# Patient Record
Sex: Male | Born: 1945
Health system: Southern US, Community
[De-identification: ages and names within clinical notes are randomized; demographics above are authoritative.]

## PROBLEM LIST (undated history)

## (undated) DIAGNOSIS — I10 Essential (primary) hypertension: Secondary | ICD-10-CM

## (undated) DIAGNOSIS — E119 Type 2 diabetes mellitus without complications: Secondary | ICD-10-CM

## (undated) HISTORY — PX: HERNIA REPAIR: SHX51

---

## 2015-09-23 DIAGNOSIS — M25461 Effusion, right knee: Secondary | ICD-10-CM | POA: Diagnosis not present

## 2015-09-23 DIAGNOSIS — M25561 Pain in right knee: Secondary | ICD-10-CM | POA: Diagnosis not present

## 2015-09-23 DIAGNOSIS — E1169 Type 2 diabetes mellitus with other specified complication: Secondary | ICD-10-CM | POA: Diagnosis not present

## 2015-09-23 DIAGNOSIS — I10 Essential (primary) hypertension: Secondary | ICD-10-CM | POA: Diagnosis not present

## 2015-10-02 DIAGNOSIS — M25461 Effusion, right knee: Secondary | ICD-10-CM | POA: Diagnosis not present

## 2015-10-02 DIAGNOSIS — M1710 Unilateral primary osteoarthritis, unspecified knee: Secondary | ICD-10-CM | POA: Diagnosis not present

## 2015-10-02 DIAGNOSIS — G8929 Other chronic pain: Secondary | ICD-10-CM | POA: Diagnosis not present

## 2015-10-02 DIAGNOSIS — M25561 Pain in right knee: Secondary | ICD-10-CM | POA: Diagnosis not present

## 2015-10-21 DIAGNOSIS — E1169 Type 2 diabetes mellitus with other specified complication: Secondary | ICD-10-CM | POA: Diagnosis not present

## 2015-10-21 DIAGNOSIS — E559 Vitamin D deficiency, unspecified: Secondary | ICD-10-CM | POA: Diagnosis not present

## 2015-10-21 DIAGNOSIS — M1710 Unilateral primary osteoarthritis, unspecified knee: Secondary | ICD-10-CM | POA: Diagnosis not present

## 2015-10-21 DIAGNOSIS — I10 Essential (primary) hypertension: Secondary | ICD-10-CM | POA: Diagnosis not present

## 2015-10-21 DIAGNOSIS — M545 Low back pain: Secondary | ICD-10-CM | POA: Diagnosis not present

## 2015-10-21 DIAGNOSIS — M542 Cervicalgia: Secondary | ICD-10-CM | POA: Diagnosis not present

## 2015-10-27 DIAGNOSIS — E118 Type 2 diabetes mellitus with unspecified complications: Secondary | ICD-10-CM | POA: Diagnosis not present

## 2015-10-27 DIAGNOSIS — E119 Type 2 diabetes mellitus without complications: Secondary | ICD-10-CM | POA: Diagnosis not present

## 2015-10-27 DIAGNOSIS — R531 Weakness: Secondary | ICD-10-CM | POA: Diagnosis not present

## 2015-10-27 DIAGNOSIS — I10 Essential (primary) hypertension: Secondary | ICD-10-CM | POA: Diagnosis not present

## 2015-10-27 DIAGNOSIS — M170 Bilateral primary osteoarthritis of knee: Secondary | ICD-10-CM | POA: Diagnosis not present

## 2015-10-27 DIAGNOSIS — D72829 Elevated white blood cell count, unspecified: Secondary | ICD-10-CM | POA: Diagnosis not present

## 2015-10-27 DIAGNOSIS — M5432 Sciatica, left side: Secondary | ICD-10-CM | POA: Diagnosis not present

## 2015-10-27 DIAGNOSIS — M79604 Pain in right leg: Secondary | ICD-10-CM | POA: Diagnosis not present

## 2015-10-27 DIAGNOSIS — E876 Hypokalemia: Secondary | ICD-10-CM | POA: Diagnosis not present

## 2015-10-27 DIAGNOSIS — M6281 Muscle weakness (generalized): Secondary | ICD-10-CM | POA: Diagnosis not present

## 2015-10-27 DIAGNOSIS — M25462 Effusion, left knee: Secondary | ICD-10-CM | POA: Diagnosis not present

## 2015-10-27 DIAGNOSIS — R262 Difficulty in walking, not elsewhere classified: Secondary | ICD-10-CM | POA: Diagnosis not present

## 2015-10-27 DIAGNOSIS — R52 Pain, unspecified: Secondary | ICD-10-CM | POA: Diagnosis not present

## 2015-10-27 DIAGNOSIS — M25461 Effusion, right knee: Secondary | ICD-10-CM | POA: Diagnosis not present

## 2015-10-27 DIAGNOSIS — M2550 Pain in unspecified joint: Secondary | ICD-10-CM | POA: Diagnosis not present

## 2015-10-27 DIAGNOSIS — M47816 Spondylosis without myelopathy or radiculopathy, lumbar region: Secondary | ICD-10-CM | POA: Diagnosis not present

## 2015-10-28 DIAGNOSIS — M170 Bilateral primary osteoarthritis of knee: Secondary | ICD-10-CM | POA: Diagnosis not present

## 2015-10-28 DIAGNOSIS — M6281 Muscle weakness (generalized): Secondary | ICD-10-CM | POA: Diagnosis not present

## 2015-10-28 DIAGNOSIS — I10 Essential (primary) hypertension: Secondary | ICD-10-CM | POA: Diagnosis not present

## 2015-10-28 DIAGNOSIS — E876 Hypokalemia: Secondary | ICD-10-CM | POA: Diagnosis not present

## 2015-10-28 DIAGNOSIS — D72829 Elevated white blood cell count, unspecified: Secondary | ICD-10-CM | POA: Diagnosis not present

## 2015-10-28 DIAGNOSIS — E118 Type 2 diabetes mellitus with unspecified complications: Secondary | ICD-10-CM | POA: Diagnosis not present

## 2015-10-29 DIAGNOSIS — E876 Hypokalemia: Secondary | ICD-10-CM | POA: Diagnosis not present

## 2015-10-29 DIAGNOSIS — E119 Type 2 diabetes mellitus without complications: Secondary | ICD-10-CM | POA: Diagnosis not present

## 2015-10-29 DIAGNOSIS — M170 Bilateral primary osteoarthritis of knee: Secondary | ICD-10-CM | POA: Diagnosis not present

## 2015-10-29 DIAGNOSIS — R531 Weakness: Secondary | ICD-10-CM | POA: Diagnosis not present

## 2015-10-30 DIAGNOSIS — M1710 Unilateral primary osteoarthritis, unspecified knee: Secondary | ICD-10-CM | POA: Diagnosis not present

## 2015-10-30 DIAGNOSIS — M25562 Pain in left knee: Secondary | ICD-10-CM | POA: Diagnosis not present

## 2015-10-30 DIAGNOSIS — E876 Hypokalemia: Secondary | ICD-10-CM | POA: Diagnosis not present

## 2015-10-30 DIAGNOSIS — G8929 Other chronic pain: Secondary | ICD-10-CM | POA: Diagnosis not present

## 2015-10-30 DIAGNOSIS — M25561 Pain in right knee: Secondary | ICD-10-CM | POA: Diagnosis not present

## 2015-10-30 DIAGNOSIS — M5442 Lumbago with sciatica, left side: Secondary | ICD-10-CM | POA: Diagnosis not present

## 2015-11-21 DIAGNOSIS — Z87891 Personal history of nicotine dependence: Secondary | ICD-10-CM | POA: Diagnosis not present

## 2015-11-21 DIAGNOSIS — R109 Unspecified abdominal pain: Secondary | ICD-10-CM | POA: Diagnosis not present

## 2015-11-21 DIAGNOSIS — R1900 Intra-abdominal and pelvic swelling, mass and lump, unspecified site: Secondary | ICD-10-CM | POA: Diagnosis not present

## 2015-11-21 DIAGNOSIS — S39012A Strain of muscle, fascia and tendon of lower back, initial encounter: Secondary | ICD-10-CM | POA: Diagnosis not present

## 2015-11-21 DIAGNOSIS — I10 Essential (primary) hypertension: Secondary | ICD-10-CM | POA: Diagnosis not present

## 2015-11-21 DIAGNOSIS — X58XXXA Exposure to other specified factors, initial encounter: Secondary | ICD-10-CM | POA: Diagnosis not present

## 2015-11-21 DIAGNOSIS — E119 Type 2 diabetes mellitus without complications: Secondary | ICD-10-CM | POA: Diagnosis not present

## 2015-11-21 DIAGNOSIS — R911 Solitary pulmonary nodule: Secondary | ICD-10-CM | POA: Diagnosis not present

## 2015-11-25 DIAGNOSIS — R911 Solitary pulmonary nodule: Secondary | ICD-10-CM | POA: Diagnosis not present

## 2015-11-25 DIAGNOSIS — R935 Abnormal findings on diagnostic imaging of other abdominal regions, including retroperitoneum: Secondary | ICD-10-CM | POA: Diagnosis not present

## 2015-11-25 DIAGNOSIS — M545 Low back pain: Secondary | ICD-10-CM | POA: Diagnosis not present

## 2015-11-25 DIAGNOSIS — Z09 Encounter for follow-up examination after completed treatment for conditions other than malignant neoplasm: Secondary | ICD-10-CM | POA: Diagnosis not present

## 2015-11-25 DIAGNOSIS — M25561 Pain in right knee: Secondary | ICD-10-CM | POA: Diagnosis not present

## 2015-11-25 DIAGNOSIS — G8929 Other chronic pain: Secondary | ICD-10-CM | POA: Diagnosis not present

## 2015-12-02 DIAGNOSIS — M545 Low back pain: Secondary | ICD-10-CM | POA: Diagnosis not present

## 2015-12-02 DIAGNOSIS — R634 Abnormal weight loss: Secondary | ICD-10-CM | POA: Diagnosis not present

## 2015-12-02 DIAGNOSIS — R109 Unspecified abdominal pain: Secondary | ICD-10-CM | POA: Diagnosis not present

## 2015-12-24 DIAGNOSIS — E118 Type 2 diabetes mellitus with unspecified complications: Secondary | ICD-10-CM | POA: Diagnosis not present

## 2015-12-24 DIAGNOSIS — R109 Unspecified abdominal pain: Secondary | ICD-10-CM | POA: Diagnosis not present

## 2015-12-24 DIAGNOSIS — R911 Solitary pulmonary nodule: Secondary | ICD-10-CM | POA: Diagnosis not present

## 2015-12-24 DIAGNOSIS — Z125 Encounter for screening for malignant neoplasm of prostate: Secondary | ICD-10-CM | POA: Diagnosis not present

## 2015-12-24 DIAGNOSIS — I10 Essential (primary) hypertension: Secondary | ICD-10-CM | POA: Diagnosis not present

## 2015-12-24 DIAGNOSIS — R634 Abnormal weight loss: Secondary | ICD-10-CM | POA: Diagnosis not present

## 2015-12-24 DIAGNOSIS — K869 Disease of pancreas, unspecified: Secondary | ICD-10-CM | POA: Diagnosis not present

## 2015-12-24 DIAGNOSIS — M25571 Pain in right ankle and joints of right foot: Secondary | ICD-10-CM | POA: Diagnosis not present

## 2015-12-24 DIAGNOSIS — M170 Bilateral primary osteoarthritis of knee: Secondary | ICD-10-CM | POA: Diagnosis not present

## 2015-12-24 LAB — BASIC METABOLIC PANEL
BUN: 12 (ref 4–21)
CREATININE: 0.6 (ref 0.6–1.3)
Glucose: 129
POTASSIUM: 4.3 (ref 3.4–5.3)
Sodium: 140 (ref 137–147)

## 2015-12-24 LAB — HEMOGLOBIN A1C: HEMOGLOBIN A1C: 6.5

## 2015-12-24 LAB — LIPID PANEL
Cholesterol: 118 (ref 0–200)
HDL: 35 (ref 35–70)
LDL Cholesterol: 69
TRIGLYCERIDES: 69 (ref 40–160)

## 2015-12-24 LAB — HEPATIC FUNCTION PANEL
ALK PHOS: 78 (ref 25–125)
ALT: 20 (ref 10–40)
AST: 14 (ref 14–40)
BILIRUBIN, TOTAL: 0.3

## 2015-12-24 LAB — CBC AND DIFFERENTIAL
HEMATOCRIT: 32 — AB (ref 41–53)
Hemoglobin: 10.5 — AB (ref 13.5–17.5)
WBC: 7.4

## 2015-12-24 LAB — PSA: PSA: 2.6

## 2015-12-24 LAB — TSH: TSH: 0.93 (ref 0.41–5.90)

## 2015-12-25 DIAGNOSIS — R918 Other nonspecific abnormal finding of lung field: Secondary | ICD-10-CM | POA: Diagnosis not present

## 2015-12-25 DIAGNOSIS — M19071 Primary osteoarthritis, right ankle and foot: Secondary | ICD-10-CM | POA: Diagnosis not present

## 2015-12-25 DIAGNOSIS — K5790 Diverticulosis of intestine, part unspecified, without perforation or abscess without bleeding: Secondary | ICD-10-CM | POA: Diagnosis not present

## 2015-12-25 DIAGNOSIS — M25571 Pain in right ankle and joints of right foot: Secondary | ICD-10-CM | POA: Diagnosis not present

## 2015-12-25 DIAGNOSIS — K869 Disease of pancreas, unspecified: Secondary | ICD-10-CM | POA: Diagnosis not present

## 2015-12-25 DIAGNOSIS — M19079 Primary osteoarthritis, unspecified ankle and foot: Secondary | ICD-10-CM | POA: Diagnosis not present

## 2016-01-05 DIAGNOSIS — D509 Iron deficiency anemia, unspecified: Secondary | ICD-10-CM | POA: Diagnosis not present

## 2016-01-05 DIAGNOSIS — Z1211 Encounter for screening for malignant neoplasm of colon: Secondary | ICD-10-CM | POA: Diagnosis not present

## 2016-01-05 DIAGNOSIS — M170 Bilateral primary osteoarthritis of knee: Secondary | ICD-10-CM | POA: Diagnosis not present

## 2016-01-05 DIAGNOSIS — M25569 Pain in unspecified knee: Secondary | ICD-10-CM | POA: Diagnosis not present

## 2016-01-05 DIAGNOSIS — M25571 Pain in right ankle and joints of right foot: Secondary | ICD-10-CM | POA: Diagnosis not present

## 2016-01-05 DIAGNOSIS — R69 Illness, unspecified: Secondary | ICD-10-CM | POA: Diagnosis not present

## 2016-01-05 DIAGNOSIS — Z1159 Encounter for screening for other viral diseases: Secondary | ICD-10-CM | POA: Diagnosis not present

## 2016-01-05 DIAGNOSIS — M545 Low back pain: Secondary | ICD-10-CM | POA: Diagnosis not present

## 2016-01-05 DIAGNOSIS — M47816 Spondylosis without myelopathy or radiculopathy, lumbar region: Secondary | ICD-10-CM | POA: Diagnosis not present

## 2016-01-05 DIAGNOSIS — R634 Abnormal weight loss: Secondary | ICD-10-CM | POA: Diagnosis not present

## 2016-01-05 DIAGNOSIS — E118 Type 2 diabetes mellitus with unspecified complications: Secondary | ICD-10-CM | POA: Diagnosis not present

## 2016-01-05 DIAGNOSIS — R911 Solitary pulmonary nodule: Secondary | ICD-10-CM | POA: Diagnosis not present

## 2016-01-13 DIAGNOSIS — M11869 Other specified crystal arthropathies, unspecified knee: Secondary | ICD-10-CM | POA: Diagnosis not present

## 2016-01-13 DIAGNOSIS — R634 Abnormal weight loss: Secondary | ICD-10-CM | POA: Diagnosis not present

## 2016-01-15 DIAGNOSIS — D509 Iron deficiency anemia, unspecified: Secondary | ICD-10-CM | POA: Diagnosis not present

## 2016-01-15 DIAGNOSIS — Z01818 Encounter for other preprocedural examination: Secondary | ICD-10-CM | POA: Diagnosis not present

## 2016-01-15 DIAGNOSIS — Z1211 Encounter for screening for malignant neoplasm of colon: Secondary | ICD-10-CM | POA: Diagnosis not present

## 2016-01-28 DIAGNOSIS — E119 Type 2 diabetes mellitus without complications: Secondary | ICD-10-CM | POA: Diagnosis not present

## 2016-01-28 DIAGNOSIS — K648 Other hemorrhoids: Secondary | ICD-10-CM | POA: Diagnosis not present

## 2016-01-28 DIAGNOSIS — K449 Diaphragmatic hernia without obstruction or gangrene: Secondary | ICD-10-CM | POA: Diagnosis not present

## 2016-01-28 DIAGNOSIS — B9681 Helicobacter pylori [H. pylori] as the cause of diseases classified elsewhere: Secondary | ICD-10-CM | POA: Diagnosis not present

## 2016-01-28 DIAGNOSIS — I10 Essential (primary) hypertension: Secondary | ICD-10-CM | POA: Diagnosis not present

## 2016-01-28 DIAGNOSIS — K2950 Unspecified chronic gastritis without bleeding: Secondary | ICD-10-CM | POA: Diagnosis not present

## 2016-01-28 DIAGNOSIS — D509 Iron deficiency anemia, unspecified: Secondary | ICD-10-CM | POA: Diagnosis not present

## 2016-01-28 DIAGNOSIS — Z1211 Encounter for screening for malignant neoplasm of colon: Secondary | ICD-10-CM | POA: Diagnosis not present

## 2016-01-28 DIAGNOSIS — R634 Abnormal weight loss: Secondary | ICD-10-CM | POA: Diagnosis not present

## 2016-01-28 DIAGNOSIS — K5909 Other constipation: Secondary | ICD-10-CM | POA: Diagnosis not present

## 2016-01-28 DIAGNOSIS — M1990 Unspecified osteoarthritis, unspecified site: Secondary | ICD-10-CM | POA: Diagnosis not present

## 2016-01-28 DIAGNOSIS — K5730 Diverticulosis of large intestine without perforation or abscess without bleeding: Secondary | ICD-10-CM | POA: Diagnosis not present

## 2016-01-28 DIAGNOSIS — G4730 Sleep apnea, unspecified: Secondary | ICD-10-CM | POA: Diagnosis not present

## 2016-02-11 DIAGNOSIS — M109 Gout, unspecified: Secondary | ICD-10-CM | POA: Diagnosis not present

## 2016-02-11 DIAGNOSIS — Z1321 Encounter for screening for nutritional disorder: Secondary | ICD-10-CM | POA: Diagnosis not present

## 2016-02-11 DIAGNOSIS — M11869 Other specified crystal arthropathies, unspecified knee: Secondary | ICD-10-CM | POA: Diagnosis not present

## 2016-02-11 DIAGNOSIS — I2510 Atherosclerotic heart disease of native coronary artery without angina pectoris: Secondary | ICD-10-CM | POA: Diagnosis not present

## 2016-02-11 DIAGNOSIS — M5136 Other intervertebral disc degeneration, lumbar region: Secondary | ICD-10-CM | POA: Diagnosis not present

## 2016-02-11 DIAGNOSIS — M170 Bilateral primary osteoarthritis of knee: Secondary | ICD-10-CM | POA: Diagnosis not present

## 2016-02-11 DIAGNOSIS — R911 Solitary pulmonary nodule: Secondary | ICD-10-CM | POA: Diagnosis not present

## 2016-02-11 DIAGNOSIS — D508 Other iron deficiency anemias: Secondary | ICD-10-CM | POA: Diagnosis not present

## 2016-02-11 DIAGNOSIS — D509 Iron deficiency anemia, unspecified: Secondary | ICD-10-CM | POA: Diagnosis not present

## 2016-02-11 DIAGNOSIS — I10 Essential (primary) hypertension: Secondary | ICD-10-CM | POA: Diagnosis not present

## 2016-02-11 DIAGNOSIS — M545 Low back pain: Secondary | ICD-10-CM | POA: Diagnosis not present

## 2016-03-11 DIAGNOSIS — H524 Presbyopia: Secondary | ICD-10-CM | POA: Diagnosis not present

## 2016-03-11 DIAGNOSIS — Z136 Encounter for screening for cardiovascular disorders: Secondary | ICD-10-CM | POA: Diagnosis not present

## 2016-03-11 DIAGNOSIS — H2513 Age-related nuclear cataract, bilateral: Secondary | ICD-10-CM | POA: Diagnosis not present

## 2016-03-11 DIAGNOSIS — H5203 Hypermetropia, bilateral: Secondary | ICD-10-CM | POA: Diagnosis not present

## 2016-03-11 DIAGNOSIS — I2510 Atherosclerotic heart disease of native coronary artery without angina pectoris: Secondary | ICD-10-CM | POA: Diagnosis not present

## 2016-03-11 DIAGNOSIS — H35033 Hypertensive retinopathy, bilateral: Secondary | ICD-10-CM | POA: Diagnosis not present

## 2016-03-11 DIAGNOSIS — H04123 Dry eye syndrome of bilateral lacrimal glands: Secondary | ICD-10-CM | POA: Diagnosis not present

## 2016-03-11 DIAGNOSIS — Z87891 Personal history of nicotine dependence: Secondary | ICD-10-CM | POA: Diagnosis not present

## 2016-03-11 DIAGNOSIS — E119 Type 2 diabetes mellitus without complications: Secondary | ICD-10-CM | POA: Diagnosis not present

## 2016-03-11 DIAGNOSIS — H52223 Regular astigmatism, bilateral: Secondary | ICD-10-CM | POA: Diagnosis not present

## 2016-07-01 DIAGNOSIS — M25571 Pain in right ankle and joints of right foot: Secondary | ICD-10-CM | POA: Diagnosis not present

## 2016-07-01 DIAGNOSIS — M11269 Other chondrocalcinosis, unspecified knee: Secondary | ICD-10-CM | POA: Diagnosis not present

## 2016-07-01 DIAGNOSIS — M170 Bilateral primary osteoarthritis of knee: Secondary | ICD-10-CM | POA: Diagnosis not present

## 2016-07-01 DIAGNOSIS — M545 Low back pain: Secondary | ICD-10-CM | POA: Diagnosis not present

## 2016-07-01 DIAGNOSIS — M1189 Other specified crystal arthropathies, multiple sites: Secondary | ICD-10-CM | POA: Diagnosis not present

## 2016-07-01 DIAGNOSIS — M109 Gout, unspecified: Secondary | ICD-10-CM | POA: Diagnosis not present

## 2016-07-01 DIAGNOSIS — K2960 Other gastritis without bleeding: Secondary | ICD-10-CM | POA: Diagnosis not present

## 2016-07-08 DIAGNOSIS — I10 Essential (primary) hypertension: Secondary | ICD-10-CM | POA: Diagnosis not present

## 2016-07-08 DIAGNOSIS — M109 Gout, unspecified: Secondary | ICD-10-CM | POA: Diagnosis not present

## 2016-07-13 DIAGNOSIS — M170 Bilateral primary osteoarthritis of knee: Secondary | ICD-10-CM | POA: Diagnosis not present

## 2016-07-13 DIAGNOSIS — M1990 Unspecified osteoarthritis, unspecified site: Secondary | ICD-10-CM | POA: Diagnosis not present

## 2016-07-13 DIAGNOSIS — M069 Rheumatoid arthritis, unspecified: Secondary | ICD-10-CM | POA: Diagnosis not present

## 2016-07-17 DIAGNOSIS — R600 Localized edema: Secondary | ICD-10-CM | POA: Diagnosis not present

## 2016-07-17 DIAGNOSIS — E0840 Diabetes mellitus due to underlying condition with diabetic neuropathy, unspecified: Secondary | ICD-10-CM | POA: Diagnosis not present

## 2016-07-17 DIAGNOSIS — R3915 Urgency of urination: Secondary | ICD-10-CM | POA: Diagnosis not present

## 2016-07-17 DIAGNOSIS — I10 Essential (primary) hypertension: Secondary | ICD-10-CM | POA: Diagnosis not present

## 2016-07-17 DIAGNOSIS — Z7982 Long term (current) use of aspirin: Secondary | ICD-10-CM | POA: Diagnosis not present

## 2016-07-17 DIAGNOSIS — M159 Polyosteoarthritis, unspecified: Secondary | ICD-10-CM | POA: Diagnosis not present

## 2016-07-17 DIAGNOSIS — Z0000 Encounter for general adult medical examination without abnormal findings: Secondary | ICD-10-CM | POA: Diagnosis not present

## 2016-07-17 DIAGNOSIS — Z6836 Body mass index (BMI) 36.0-36.9, adult: Secondary | ICD-10-CM | POA: Diagnosis not present

## 2016-07-17 DIAGNOSIS — K219 Gastro-esophageal reflux disease without esophagitis: Secondary | ICD-10-CM | POA: Diagnosis not present

## 2016-07-22 DIAGNOSIS — M170 Bilateral primary osteoarthritis of knee: Secondary | ICD-10-CM | POA: Diagnosis not present

## 2016-07-22 DIAGNOSIS — M069 Rheumatoid arthritis, unspecified: Secondary | ICD-10-CM | POA: Diagnosis not present

## 2016-07-22 DIAGNOSIS — E559 Vitamin D deficiency, unspecified: Secondary | ICD-10-CM | POA: Diagnosis not present

## 2016-07-22 DIAGNOSIS — M1990 Unspecified osteoarthritis, unspecified site: Secondary | ICD-10-CM | POA: Diagnosis not present

## 2016-07-22 DIAGNOSIS — I2510 Atherosclerotic heart disease of native coronary artery without angina pectoris: Secondary | ICD-10-CM | POA: Diagnosis not present

## 2016-07-22 DIAGNOSIS — I10 Essential (primary) hypertension: Secondary | ICD-10-CM | POA: Diagnosis not present

## 2016-07-22 DIAGNOSIS — M5136 Other intervertebral disc degeneration, lumbar region: Secondary | ICD-10-CM | POA: Diagnosis not present

## 2016-07-22 DIAGNOSIS — E118 Type 2 diabetes mellitus with unspecified complications: Secondary | ICD-10-CM | POA: Diagnosis not present

## 2016-07-23 DIAGNOSIS — M069 Rheumatoid arthritis, unspecified: Secondary | ICD-10-CM | POA: Diagnosis not present

## 2016-07-23 DIAGNOSIS — Z79899 Other long term (current) drug therapy: Secondary | ICD-10-CM | POA: Diagnosis not present

## 2016-07-23 DIAGNOSIS — M0579 Rheumatoid arthritis with rheumatoid factor of multiple sites without organ or systems involvement: Secondary | ICD-10-CM | POA: Diagnosis not present

## 2016-07-26 DIAGNOSIS — Z13220 Encounter for screening for lipoid disorders: Secondary | ICD-10-CM | POA: Diagnosis not present

## 2016-07-26 DIAGNOSIS — I10 Essential (primary) hypertension: Secondary | ICD-10-CM | POA: Diagnosis not present

## 2016-07-26 DIAGNOSIS — E118 Type 2 diabetes mellitus with unspecified complications: Secondary | ICD-10-CM | POA: Diagnosis not present

## 2016-12-24 DIAGNOSIS — Z79899 Other long term (current) drug therapy: Secondary | ICD-10-CM | POA: Diagnosis not present

## 2016-12-24 DIAGNOSIS — M0579 Rheumatoid arthritis with rheumatoid factor of multiple sites without organ or systems involvement: Secondary | ICD-10-CM | POA: Diagnosis not present

## 2017-01-21 DIAGNOSIS — I2510 Atherosclerotic heart disease of native coronary artery without angina pectoris: Secondary | ICD-10-CM | POA: Diagnosis not present

## 2017-01-21 DIAGNOSIS — D509 Iron deficiency anemia, unspecified: Secondary | ICD-10-CM | POA: Diagnosis not present

## 2017-01-21 DIAGNOSIS — E559 Vitamin D deficiency, unspecified: Secondary | ICD-10-CM | POA: Diagnosis not present

## 2017-01-21 DIAGNOSIS — E118 Type 2 diabetes mellitus with unspecified complications: Secondary | ICD-10-CM | POA: Diagnosis not present

## 2017-01-21 DIAGNOSIS — I10 Essential (primary) hypertension: Secondary | ICD-10-CM | POA: Diagnosis not present

## 2017-01-21 DIAGNOSIS — K2960 Other gastritis without bleeding: Secondary | ICD-10-CM | POA: Diagnosis not present

## 2017-01-28 DIAGNOSIS — I10 Essential (primary) hypertension: Secondary | ICD-10-CM | POA: Diagnosis not present

## 2017-02-04 DIAGNOSIS — I10 Essential (primary) hypertension: Secondary | ICD-10-CM | POA: Diagnosis not present

## 2017-02-04 DIAGNOSIS — I2510 Atherosclerotic heart disease of native coronary artery without angina pectoris: Secondary | ICD-10-CM | POA: Diagnosis not present

## 2017-04-15 DIAGNOSIS — M0579 Rheumatoid arthritis with rheumatoid factor of multiple sites without organ or systems involvement: Secondary | ICD-10-CM | POA: Diagnosis not present

## 2017-04-15 DIAGNOSIS — Z79899 Other long term (current) drug therapy: Secondary | ICD-10-CM | POA: Diagnosis not present

## 2017-04-22 DIAGNOSIS — Z79899 Other long term (current) drug therapy: Secondary | ICD-10-CM | POA: Diagnosis not present

## 2017-04-22 DIAGNOSIS — M0579 Rheumatoid arthritis with rheumatoid factor of multiple sites without organ or systems involvement: Secondary | ICD-10-CM | POA: Diagnosis not present

## 2017-07-15 ENCOUNTER — Encounter: Payer: Self-pay | Admitting: Family Medicine

## 2017-07-15 ENCOUNTER — Ambulatory Visit (INDEPENDENT_AMBULATORY_CARE_PROVIDER_SITE_OTHER): Payer: Medicare HMO | Admitting: Family Medicine

## 2017-07-15 VITALS — BP 142/90 | HR 86 | Temp 98.2°F | Ht 69.0 in | Wt 263.1 lb

## 2017-07-15 DIAGNOSIS — R1013 Epigastric pain: Secondary | ICD-10-CM | POA: Diagnosis not present

## 2017-07-15 DIAGNOSIS — M0579 Rheumatoid arthritis with rheumatoid factor of multiple sites without organ or systems involvement: Secondary | ICD-10-CM | POA: Diagnosis not present

## 2017-07-15 DIAGNOSIS — I10 Essential (primary) hypertension: Secondary | ICD-10-CM

## 2017-07-15 DIAGNOSIS — Z1211 Encounter for screening for malignant neoplasm of colon: Secondary | ICD-10-CM

## 2017-07-15 DIAGNOSIS — E119 Type 2 diabetes mellitus without complications: Secondary | ICD-10-CM | POA: Diagnosis not present

## 2017-07-15 DIAGNOSIS — I2510 Atherosclerotic heart disease of native coronary artery without angina pectoris: Secondary | ICD-10-CM

## 2017-07-15 DIAGNOSIS — R0602 Shortness of breath: Secondary | ICD-10-CM | POA: Diagnosis not present

## 2017-07-15 DIAGNOSIS — Z125 Encounter for screening for malignant neoplasm of prostate: Secondary | ICD-10-CM | POA: Diagnosis not present

## 2017-07-15 DIAGNOSIS — E1165 Type 2 diabetes mellitus with hyperglycemia: Secondary | ICD-10-CM | POA: Insufficient documentation

## 2017-07-15 DIAGNOSIS — Z0000 Encounter for general adult medical examination without abnormal findings: Secondary | ICD-10-CM | POA: Insufficient documentation

## 2017-07-15 NOTE — Progress Notes (Signed)
Subjective:  Patient ID: Mitchell Rose, male    DOB: 03/19/1946  Age: 72 y.o. MRN: [id number]  CC: Establish Care   HPI Kazumi Lachney presents for the establishment of care.  He is well-known to me.  He is nonfasting today.  He has an history of of coronary artery disease with a normal lipid profile.  He has been compliant with his BP regimen but tells that he did have a high salt load meal before coming in.  He is experiencing some shortness of breath with exertion that is new for him.  He has not seen his cardiologist in over a year.  He also has been having some midepigastric abdominal pain  For about a week now.  He denies chest pain, nausea and vomiting, diaphoresis or change in his stooling patterns.  Says that he stools once or twice a day and his stools of been without melena or blood.  He is about due for a follow-up colonoscopy.  Wants me to schedule it.  His urine flow is good he does not deal with hesitancy or nocturia.  He refuses the flu vaccine because his sister got one and died.  He does not smoke drink alcohol or use illicit drugs.  He continues to live with his wife.  He continues to work at [employer].  Outpatient Medications Prior to Visit  Medication Sig Dispense Refill  . amLODipine (NORVASC) 5 MG tablet Take 1 tablet by mouth daily.    Marland Kitchen aspirin 81 MG chewable tablet Chew 1 tablet by mouth daily.    . chlorthalidone (HYGROTON) 25 MG tablet Take 1 tablet by mouth daily.    . folic acid (FOLVITE) 1 MG tablet Take 1 tablet by mouth daily.    . metFORMIN (GLUCOPHAGE) 1000 MG tablet Take 1 tablet by mouth 2 (two) times daily.    . methotrexate (RHEUMATREX) 2.5 MG tablet Take 7 tablets by mouth once a week.    . metoprolol tartrate (LOPRESSOR) 100 MG tablet Take 100 mg by mouth 2 (two) times daily.    Marland Kitchen omeprazole (PRILOSEC) 20 MG capsule Take 1 capsule by mouth daily.    . predniSONE (DELTASONE) 5 MG tablet Take 1 tablet by mouth daily.     No facility-administered  medications prior to visit.     ROS Review of Systems  Constitutional: Negative.  Negative for chills, fatigue, fever and unexpected weight change.  HENT: Negative.   Eyes: Negative for photophobia and visual disturbance.  Respiratory: Positive for shortness of breath. Negative for chest tightness and wheezing.   Cardiovascular: Negative for chest pain, palpitations and leg swelling.  Gastrointestinal: Negative for anal bleeding, blood in stool, constipation, diarrhea, nausea, rectal pain and vomiting.  Endocrine: Negative for polyphagia and polyuria.  Genitourinary: Negative for difficulty urinating, frequency and hematuria.  Skin: Negative for color change and rash.  Allergic/Immunologic: Negative for immunocompromised state.  Neurological: Negative for speech difficulty and headaches.  Hematological: Does not bruise/bleed easily.  Psychiatric/Behavioral: Negative.  Negative for dysphoric mood. The patient is not nervous/anxious.     Objective:  BP (!) 142/90 (BP Location: Left Arm, Patient Position: Sitting, Cuff Size: Normal)   Pulse 86   Temp 98.2 F (36.8 C) (Oral)   Ht 5\' 9"  (1.753 m)   Wt 263 lb 2 oz (119.4 kg)   SpO2 99%   BMI 38.86 kg/m   BP Readings from Last 3 Encounters:  07/15/17 (!) 142/90    Wt Readings from Last 3 Encounters:  07/15/17 263 lb 2 oz (119.4 kg)    Physical Exam  Constitutional: He is oriented to person, place, and time. He appears well-developed and well-nourished. No distress.  HENT:  Head: Normocephalic and atraumatic.  Right Ear: External ear normal.  Left Ear: External ear normal.  Mouth/Throat: Oropharynx is clear and moist. No oropharyngeal exudate.  Eyes: Conjunctivae and EOM are normal. Pupils are equal, round, and reactive to light. Right eye exhibits no discharge. Left eye exhibits no discharge. No scleral icterus.  Neck: Neck supple. No JVD present. No tracheal deviation present.  Cardiovascular: Normal rate, regular rhythm and  normal heart sounds.  Pulmonary/Chest: Breath sounds normal. No stridor. No respiratory distress. He has no wheezes. He has no rales.  Abdominal: Soft. Bowel sounds are normal. He exhibits distension. There is no tenderness. There is no rebound, no guarding and no CVA tenderness. A hernia is present. Hernia confirmed positive in the ventral area.  Genitourinary: Rectal exam shows no external hemorrhoid, no internal hemorrhoid, no fissure, no mass, no tenderness, anal tone normal and guaiac negative stool. Prostate is not enlarged and not tender.  Lymphadenopathy:    He has no cervical adenopathy.  Neurological: He is alert and oriented to person, place, and time.  Skin: Skin is warm and dry. He is not diaphoretic.  Psychiatric: He has a normal mood and affect. His behavior is normal.    No results found for: WBC, HGB, HCT, PLT, GLUCOSE, CHOL, TRIG, HDL, LDLDIRECT, LDLCALC, ALT, AST, NA, K, CL, CREATININE, BUN, CO2, TSH, PSA, INR, GLUF, HGBA1C, MICROALBUR  Patient was never admitted.  Assessment & Plan:   Brigg was seen today for establish care.  Diagnoses and all orders for this visit:  Essential hypertension -     CBC; Future -     Comprehensive metabolic panel; Future -     TSH; Future -     Urinalysis, Routine w reflex microscopic; Future  ASCVD (arteriosclerotic cardiovascular disease) -     CBC; Future -     Comprehensive metabolic panel; Future -     Lipid panel; Future -     TSH; Future -     Ambulatory referral to Cardiology  Rheumatoid arthritis involving multiple sites with positive rheumatoid factor ([place])  Screen for colon cancer -     Ambulatory referral to Gastroenterology  Screening for prostate cancer -     PSA; Future  Epigastric pain -     Amylase; Future -     CBC; Future -     Comprehensive metabolic panel; Future  Shortness of breath -     TSH; Future -     Ambulatory referral to Cardiology  Controlled type 2 diabetes mellitus without  complication, without long-term current use of insulin (HCC) -     Hemoglobin A1c; Future -     Microalbumin / creatinine urine ratio; Future   I am having Candee Furbish maintain his amLODipine, aspirin, chlorthalidone, folic acid, metFORMIN, methotrexate, omeprazole, predniSONE, and metoprolol tartrate.  No orders of the defined types were placed in this encounter.  He will return on Monday for fasting lab work.  I asked him to return next week if his abdominal pain worsens or does not improve on its own.  Follow-up: No Follow-up on file.  Libby Maw, MD

## 2017-07-18 ENCOUNTER — Other Ambulatory Visit (INDEPENDENT_AMBULATORY_CARE_PROVIDER_SITE_OTHER): Payer: Medicare HMO

## 2017-07-18 DIAGNOSIS — I2510 Atherosclerotic heart disease of native coronary artery without angina pectoris: Secondary | ICD-10-CM | POA: Diagnosis not present

## 2017-07-18 DIAGNOSIS — I10 Essential (primary) hypertension: Secondary | ICD-10-CM | POA: Diagnosis not present

## 2017-07-18 DIAGNOSIS — Z125 Encounter for screening for malignant neoplasm of prostate: Secondary | ICD-10-CM | POA: Diagnosis not present

## 2017-07-18 DIAGNOSIS — E119 Type 2 diabetes mellitus without complications: Secondary | ICD-10-CM

## 2017-07-18 DIAGNOSIS — R1013 Epigastric pain: Secondary | ICD-10-CM

## 2017-07-18 DIAGNOSIS — R0602 Shortness of breath: Secondary | ICD-10-CM

## 2017-07-18 LAB — CBC WITH DIFFERENTIAL/PLATELET
BASOS ABS: 53 {cells}/uL (ref 0–200)
BASOS PCT: 0.8 %
EOS ABS: 172 {cells}/uL (ref 15–500)
Eosinophils Relative: 2.6 %
HCT: 38.1 % — ABNORMAL LOW (ref 38.5–50.0)
Hemoglobin: 12.9 g/dL — ABNORMAL LOW (ref 13.2–17.1)
Lymphs Abs: 3267 cells/uL (ref 850–3900)
MCH: 29.3 pg (ref 27.0–33.0)
MCHC: 33.9 g/dL (ref 32.0–36.0)
MCV: 86.4 fL (ref 80.0–100.0)
MONOS PCT: 10.8 %
MPV: 11 fL (ref 7.5–12.5)
NEUTROS PCT: 36.3 %
Neutro Abs: 2396 cells/uL (ref 1500–7800)
PLATELETS: 333 10*3/uL (ref 140–400)
RBC: 4.41 10*6/uL (ref 4.20–5.80)
RDW: 13.9 % (ref 11.0–15.0)
TOTAL LYMPHOCYTE: 49.5 %
WBC: 6.6 10*3/uL (ref 3.8–10.8)
WBCMIX: 713 {cells}/uL (ref 200–950)

## 2017-07-18 LAB — URINALYSIS, ROUTINE W REFLEX MICROSCOPIC
Bilirubin Urine: NEGATIVE
Hgb urine dipstick: NEGATIVE
KETONES UR: NEGATIVE
Leukocytes, UA: NEGATIVE
Nitrite: NEGATIVE
PH: 7.5 (ref 5.0–8.0)
RBC / HPF: NONE SEEN (ref 0–?)
SPECIFIC GRAVITY, URINE: 1.01 (ref 1.000–1.030)
TOTAL PROTEIN, URINE-UPE24: NEGATIVE
URINE GLUCOSE: NEGATIVE
UROBILINOGEN UA: 1 (ref 0.0–1.0)

## 2017-07-18 LAB — COMPREHENSIVE METABOLIC PANEL
ALT: 18 U/L (ref 0–53)
AST: 15 U/L (ref 0–37)
Albumin: 4 g/dL (ref 3.5–5.2)
Alkaline Phosphatase: 53 U/L (ref 39–117)
BUN: 13 mg/dL (ref 6–23)
CO2: 34 meq/L — AB (ref 19–32)
Calcium: 9.8 mg/dL (ref 8.4–10.5)
Chloride: 96 mEq/L (ref 96–112)
Creatinine, Ser: 0.84 mg/dL (ref 0.40–1.50)
GFR: 96.17 mL/min (ref >60.00)
GLUCOSE: 137 mg/dL — AB (ref 70–99)
POTASSIUM: 3.8 meq/L (ref 3.5–5.1)
Sodium: 143 mEq/L (ref 135–145)
Total Bilirubin: 0.4 mg/dL (ref 0.2–1.2)
Total Protein: 7.4 g/dL (ref 6.0–8.3)

## 2017-07-18 LAB — LIPID PANEL
CHOL/HDL RATIO: 5
Cholesterol: 159 mg/dL (ref 0–200)
HDL: 34.2 mg/dL — AB (ref >39.00)
LDL CALC: 102 mg/dL — AB (ref 0–99)
NONHDL: 124.69
Triglycerides: 112 mg/dL (ref 0.0–149.0)
VLDL: 22.4 mg/dL (ref 0.0–40.0)

## 2017-07-18 LAB — TSH: TSH: 1.36 u[IU]/mL (ref 0.35–4.50)

## 2017-07-18 LAB — MICROALBUMIN / CREATININE URINE RATIO
Creatinine,U: 149.9 mg/dL
Microalb Creat Ratio: 1 mg/g (ref 0.0–30.0)
Microalb, Ur: 1.5 mg/dL (ref 0.0–1.9)

## 2017-07-18 LAB — PSA: PSA: 2.73 ng/mL (ref 0.10–4.00)

## 2017-07-18 LAB — AMYLASE: AMYLASE: 81 U/L (ref 27–131)

## 2017-07-18 LAB — HEMOGLOBIN A1C: Hgb A1c MFr Bld: 7.9 % — ABNORMAL HIGH (ref 4.6–6.5)

## 2017-07-18 NOTE — Addendum Note (Signed)
Addended by: Diona Foley on: 07/18/2017 08:30 AM   Modules accepted: Orders

## 2017-07-19 ENCOUNTER — Telehealth: Payer: Self-pay

## 2017-07-19 NOTE — Telephone Encounter (Signed)
Dr. Ethelene Hal would like to see patient to go over his lab results, please help him to get scheduled.  Thank you guys!

## 2017-07-20 NOTE — Telephone Encounter (Signed)
I have called and  Spoken to patient wife. Patient wife schedule an appointment for 07/22/17 @ 11am with Dr. Ethelene Hal.

## 2017-07-22 ENCOUNTER — Encounter: Payer: Self-pay | Admitting: Family Medicine

## 2017-07-22 ENCOUNTER — Ambulatory Visit (INDEPENDENT_AMBULATORY_CARE_PROVIDER_SITE_OTHER): Payer: Medicare HMO | Admitting: Family Medicine

## 2017-07-22 VITALS — BP 130/80 | HR 80 | Ht 69.0 in | Wt 265.0 lb

## 2017-07-22 DIAGNOSIS — E1165 Type 2 diabetes mellitus with hyperglycemia: Secondary | ICD-10-CM

## 2017-07-22 DIAGNOSIS — I2510 Atherosclerotic heart disease of native coronary artery without angina pectoris: Secondary | ICD-10-CM | POA: Diagnosis not present

## 2017-07-22 DIAGNOSIS — D649 Anemia, unspecified: Secondary | ICD-10-CM | POA: Diagnosis not present

## 2017-07-22 DIAGNOSIS — E7800 Pure hypercholesterolemia, unspecified: Secondary | ICD-10-CM

## 2017-07-22 MED ORDER — DAPAGLIFLOZIN PROPANEDIOL 5 MG PO TABS: 5.0000 mg | ORAL_TABLET | Freq: Every day | ORAL | 2 refills | Status: DC

## 2017-07-22 MED ORDER — PRAVASTATIN SODIUM 20 MG PO TABS: 20.0000 mg | ORAL_TABLET | Freq: Every day | ORAL | 1 refills | Status: DC

## 2017-07-22 NOTE — Progress Notes (Signed)
Subjective:  Patient ID: Mitchell Rose, male    DOB: 1945/11/13  Age: 72 y.o. MRN: [id number]  CC: Follow-up (discuss labs)   HPI Mitchell Rose presents for follow-up of multiple concerns that I have about his laboratory results.  His hemoglobin dropped a little bit.  It is normocytic.  He denies any blood loss through his urine or feces.  Stressed the importance that he go for the colonoscopy as this could be a colon cancer were dealing with.  He has had no change in his stooling patterns.  His diabetes control is worsening.  He admits to some weight gain.  He is compliant with his Glucophage 1000 mg twice each day.  His LDL cholesterol has crept up it is up over 100.  He has documented vascular disease.  Said that it was time to go ahead and start a statin.  Outpatient Medications Prior to Visit  Medication Sig Dispense Refill  . amLODipine (NORVASC) 5 MG tablet Take 1 tablet by mouth daily.    Marland Kitchen aspirin 81 MG chewable tablet Chew 1 tablet by mouth daily.    . chlorthalidone (HYGROTON) 25 MG tablet Take 1 tablet by mouth daily.    . folic acid (FOLVITE) 1 MG tablet Take 1 tablet by mouth daily.    . metFORMIN (GLUCOPHAGE) 1000 MG tablet Take 1 tablet by mouth 2 (two) times daily.    . methotrexate (RHEUMATREX) 2.5 MG tablet Take 7 tablets by mouth once a week.    . metoprolol tartrate (LOPRESSOR) 100 MG tablet Take 100 mg by mouth 2 (two) times daily.    Marland Kitchen omeprazole (PRILOSEC) 20 MG capsule Take 1 capsule by mouth daily.    . predniSONE (DELTASONE) 5 MG tablet Take 1 tablet by mouth daily.     No facility-administered medications prior to visit.     ROS Review of Systems  Constitutional: Negative for chills, fatigue, fever and unexpected weight change.  Respiratory: Negative.   Cardiovascular: Negative.   Gastrointestinal: Negative for abdominal distention, abdominal pain, anal bleeding, blood in stool, constipation and diarrhea.  Endocrine: Negative for polyphagia and polyuria.    Genitourinary: Negative for hematuria.  Hematological: Does not bruise/bleed easily.    Objective:  BP 130/80 (BP Location: Left Arm, Patient Position: Sitting, Cuff Size: Normal)   Pulse 80   Ht 5\' 9"  (1.753 m)   Wt 265 lb (120.2 kg)   SpO2 97%   BMI 39.13 kg/m   BP Readings from Last 3 Encounters:  07/22/17 130/80  07/15/17 (!) 142/90    Wt Readings from Last 3 Encounters:  07/22/17 265 lb (120.2 kg)  07/15/17 263 lb 2 oz (119.4 kg)    Physical Exam  Constitutional: He is oriented to person, place, and time. He appears well-developed and well-nourished. No distress.  HENT:  Head: Normocephalic and atraumatic.  Right Ear: External ear normal.  Left Ear: External ear normal.  Eyes: Right eye exhibits no discharge. Left eye exhibits no discharge. No scleral icterus.  Neck: No JVD present. No tracheal deviation present.  Pulmonary/Chest: Effort normal. No stridor.  Neurological: He is alert and oriented to person, place, and time.  Skin: Skin is warm and dry. He is not diaphoretic.  Psychiatric: He has a normal mood and affect. His behavior is normal.    Lab Results  Component Value Date   WBC 6.6 07/18/2017   HGB 12.9 (L) 07/18/2017   HCT 38.1 (L) 07/18/2017   PLT 333 07/18/2017   GLUCOSE  137 (H) 07/18/2017   CHOL 159 07/18/2017   TRIG 112.0 07/18/2017   HDL 34.20 (L) 07/18/2017   LDLCALC 102 (H) 07/18/2017   ALT 18 07/18/2017   AST 15 07/18/2017   NA 143 07/18/2017   K 3.8 07/18/2017   CL 96 07/18/2017   CREATININE 0.84 07/18/2017   BUN 13 07/18/2017   CO2 34 (H) 07/18/2017   TSH 1.36 07/18/2017   PSA 2.73 07/18/2017   HGBA1C 7.9 (H) 07/18/2017   MICROALBUR 1.5 07/18/2017    Patient was never admitted.  Assessment & Plan:   Mitchell Rose was seen today for follow-up.  Diagnoses and all orders for this visit:  ASCVD (arteriosclerotic cardiovascular disease) -     pravastatin (PRAVACHOL) 20 MG tablet; Take 1 tablet (20 mg total) by mouth at  bedtime.  Normocytic anemia  Elevated LDL cholesterol level -     pravastatin (PRAVACHOL) 20 MG tablet; Take 1 tablet (20 mg total) by mouth at bedtime.  Type 2 diabetes mellitus with hyperglycemia, without long-term current use of insulin (HCC) -     Ambulatory referral to diabetic education -     dapagliflozin propanediol (FARXIGA) 5 MG TABS tablet; Take 5 mg by mouth daily. In the morning.   I am having Mitchell Rose start on dapagliflozin propanediol and pravastatin. I am also having him maintain his amLODipine, aspirin, chlorthalidone, folic acid, metFORMIN, methotrexate, omeprazole, predniSONE, and metoprolol tartrate.  Meds ordered this encounter  Medications  . dapagliflozin propanediol (FARXIGA) 5 MG TABS tablet    Sig: Take 5 mg by mouth daily. In the morning.    Dispense:  30 tablet    Refill:  2  . pravastatin (PRAVACHOL) 20 MG tablet    Sig: Take 1 tablet (20 mg total) by mouth at bedtime.    Dispense:  90 tablet    Refill:  1   We will go ahead and start pravastatin and [place].  I have also asked for diabetic teaching with regards to nutrition.  Encouraged him to see the gastroenterologist about his colonoscopy.  I have advised him to take a multivitamin.  We will follow-up his hemoglobin, diabetes control and LDL cholesterol in 3 months.  Follow-up: Return in about 3 months (around 10/19/2017).  Libby Maw, MD

## 2017-07-28 ENCOUNTER — Encounter: Payer: Self-pay | Admitting: Family Medicine

## 2017-08-12 ENCOUNTER — Encounter: Payer: Self-pay | Admitting: Family Medicine

## 2017-09-05 ENCOUNTER — Other Ambulatory Visit: Payer: Self-pay

## 2017-09-05 MED ORDER — GABAPENTIN 300 MG PO CAPS: 300.0000 mg | ORAL_CAPSULE | Freq: Every day | ORAL | 1 refills | Status: DC

## 2017-09-21 DIAGNOSIS — Z7982 Long term (current) use of aspirin: Secondary | ICD-10-CM | POA: Diagnosis not present

## 2017-09-21 DIAGNOSIS — I2510 Atherosclerotic heart disease of native coronary artery without angina pectoris: Secondary | ICD-10-CM | POA: Diagnosis not present

## 2017-09-21 DIAGNOSIS — M0579 Rheumatoid arthritis with rheumatoid factor of multiple sites without organ or systems involvement: Secondary | ICD-10-CM | POA: Diagnosis not present

## 2017-09-21 DIAGNOSIS — R079 Chest pain, unspecified: Secondary | ICD-10-CM | POA: Diagnosis not present

## 2017-09-21 DIAGNOSIS — R001 Bradycardia, unspecified: Secondary | ICD-10-CM | POA: Diagnosis not present

## 2017-09-21 DIAGNOSIS — Z87891 Personal history of nicotine dependence: Secondary | ICD-10-CM | POA: Diagnosis not present

## 2017-09-21 DIAGNOSIS — I10 Essential (primary) hypertension: Secondary | ICD-10-CM | POA: Diagnosis not present

## 2017-09-21 DIAGNOSIS — E782 Mixed hyperlipidemia: Secondary | ICD-10-CM | POA: Diagnosis not present

## 2017-09-29 DIAGNOSIS — I2510 Atherosclerotic heart disease of native coronary artery without angina pectoris: Secondary | ICD-10-CM | POA: Diagnosis not present

## 2017-09-29 DIAGNOSIS — I10 Essential (primary) hypertension: Secondary | ICD-10-CM | POA: Diagnosis not present

## 2017-10-19 ENCOUNTER — Ambulatory Visit: Payer: Medicare HMO | Admitting: Family Medicine

## 2017-10-21 ENCOUNTER — Ambulatory Visit (INDEPENDENT_AMBULATORY_CARE_PROVIDER_SITE_OTHER): Payer: Medicare HMO | Admitting: Family Medicine

## 2017-10-21 ENCOUNTER — Ambulatory Visit: Payer: Medicare HMO | Admitting: Family Medicine

## 2017-10-21 ENCOUNTER — Encounter: Payer: Self-pay | Admitting: Family Medicine

## 2017-10-21 VITALS — BP 118/82 | HR 77 | Ht 69.0 in | Wt 258.4 lb

## 2017-10-21 DIAGNOSIS — E1165 Type 2 diabetes mellitus with hyperglycemia: Secondary | ICD-10-CM | POA: Diagnosis not present

## 2017-10-21 DIAGNOSIS — E7800 Pure hypercholesterolemia, unspecified: Secondary | ICD-10-CM

## 2017-10-21 DIAGNOSIS — I10 Essential (primary) hypertension: Secondary | ICD-10-CM

## 2017-10-21 DIAGNOSIS — I2510 Atherosclerotic heart disease of native coronary artery without angina pectoris: Secondary | ICD-10-CM

## 2017-10-21 DIAGNOSIS — D649 Anemia, unspecified: Secondary | ICD-10-CM

## 2017-10-21 LAB — POCT GLYCOSYLATED HEMOGLOBIN (HGB A1C): HEMOGLOBIN A1C: 7.2

## 2017-10-21 MED ORDER — PRAVASTATIN SODIUM 20 MG PO TABS: 20.0000 mg | ORAL_TABLET | Freq: Every day | ORAL | 2 refills | Status: DC

## 2017-10-21 MED ORDER — METFORMIN HCL 1000 MG PO TABS: 1000.0000 mg | ORAL_TABLET | Freq: Two times a day (BID) | ORAL | 2 refills | Status: DC

## 2017-10-21 MED ORDER — METOPROLOL SUCCINATE ER 100 MG PO TB24: 100.0000 mg | ORAL_TABLET | Freq: Every day | ORAL | 3 refills | Status: DC

## 2017-10-21 MED ORDER — ASPIRIN 81 MG PO CHEW: 81.0000 mg | CHEWABLE_TABLET | Freq: Every day | ORAL | 1 refills | Status: DC

## 2017-10-21 MED ORDER — CHLORTHALIDONE 25 MG PO TABS: 25.0000 mg | ORAL_TABLET | Freq: Every day | ORAL | 2 refills | Status: DC

## 2017-10-21 MED ORDER — AMLODIPINE BESYLATE 5 MG PO TABS: 5.0000 mg | ORAL_TABLET | Freq: Every day | ORAL | 2 refills | Status: DC

## 2017-10-21 NOTE — Progress Notes (Signed)
Subjective:  Patient ID: Mitchell Rose, male    DOB: [birth year]  Age: 72 y.o. MRN: [id number]  CC: Follow-up   HPI [name] presents for follow-up of his diabetes, hypertension elevated LDL cholesterol and anemia.  Blood pressure is been well controlled on his current regimen.  Will be switching him over to metoprolol titrate to the succinate form.  He.  He is taking the pravastatin as directed.  He is fasting today.  He was unable to afford the [place].  But he is taking the metformin at thousand milligrams twice daily.  His hemoglobin A1c is actually improved today to 7.2.  He has not seen an eye doctor for ever.  Has not been able to go for the screening colonoscopy has not yet.  His hemoglobin has been stable  Outpatient Medications Prior to Visit  Medication Sig Dispense Refill  . folic acid (FOLVITE) 1 MG tablet Take 1 tablet by mouth daily.    Marland Kitchen gabapentin (NEURONTIN) 300 MG capsule Take 1 capsule (300 mg total) by mouth at bedtime. 90 capsule 1  . methotrexate (RHEUMATREX) 2.5 MG tablet Take 7 tablets by mouth once a week.    . metoprolol tartrate (LOPRESSOR) 100 MG tablet Take 100 mg by mouth 2 (two) times daily.    Marland Kitchen omeprazole (PRILOSEC) 20 MG capsule Take 1 capsule by mouth daily.    . predniSONE (DELTASONE) 5 MG tablet Take 1 tablet by mouth daily.    Marland Kitchen amLODipine (NORVASC) 5 MG tablet Take 1 tablet by mouth daily.    Marland Kitchen aspirin 81 MG chewable tablet Chew 1 tablet by mouth daily.    . chlorthalidone (HYGROTON) 25 MG tablet Take 1 tablet by mouth daily.    . dapagliflozin propanediol (FARXIGA) 5 MG TABS tablet Take 5 mg by mouth daily. In the morning. 30 tablet 2  . metFORMIN (GLUCOPHAGE) 1000 MG tablet Take 1 tablet by mouth 2 (two) times daily.    . pravastatin (PRAVACHOL) 20 MG tablet Take 1 tablet (20 mg total) by mouth at bedtime. 90 tablet 1   No facility-administered medications prior to visit.     ROS Review of Systems  Constitutional: Negative for chills,  diaphoresis, fatigue, fever and unexpected weight change.  HENT: Negative.   Eyes: Negative.   Respiratory: Negative.   Cardiovascular: Negative.   Gastrointestinal: Negative.   Endocrine: Negative for polyphagia and polyuria.  Genitourinary: Negative for difficulty urinating, frequency and urgency.  Skin: Negative for rash and wound.  Allergic/Immunologic: Negative for immunocompromised state.  Neurological: Negative for headaches.  Hematological: Does not bruise/bleed easily.  Psychiatric/Behavioral: Negative.     Objective:  BP 118/82   Pulse 77   Ht 5\' 9"  (1.753 m)   Wt 258 lb 6 oz (117.2 kg)   SpO2 99%   BMI 38.16 kg/m   BP Readings from Last 3 Encounters:  10/21/17 118/82  07/22/17 130/80  07/15/17 (!) 142/90    Wt Readings from Last 3 Encounters:  10/21/17 258 lb 6 oz (117.2 kg)  07/22/17 265 lb (120.2 kg)  07/15/17 263 lb 2 oz (119.4 kg)    Physical Exam  Constitutional: He is oriented to person, place, and time. He appears well-developed and well-nourished. No distress.  HENT:  Head: Normocephalic and atraumatic.  Right Ear: External ear normal.  Left Ear: External ear normal.  Mouth/Throat: Oropharynx is clear and moist.  Eyes: Pupils are equal, round, and reactive to light. Conjunctivae and EOM are normal. Right eye exhibits no  discharge. Left eye exhibits no discharge. No scleral icterus.  Neck: Normal range of motion. Neck supple. No JVD present. No tracheal deviation present. No thyromegaly present.  Cardiovascular: Normal rate, regular rhythm and normal heart sounds.  Pulses:      Dorsalis pedis pulses are 0 on the right side, and 2+ on the left side.       Posterior tibial pulses are 1+ on the right side, and 0 on the left side.  Pulmonary/Chest: Effort normal and breath sounds normal.  Lymphadenopathy:    He has no cervical adenopathy.  Neurological: He is alert and oriented to person, place, and time.  Skin: Skin is warm and dry. Capillary refill  takes 2 to 3 seconds. No rash noted. He is not diaphoretic. No erythema. No pallor.  Psychiatric: He has a normal mood and affect. His behavior is normal.   Diabetic Foot Exam   Diabetic Foot Exam - Simple   Simple Foot Form  Visual Inspection  No deformities, no ulcerations, no other skin breakdown bilaterally: Yes  Sensation Testing  Intact to touch and monofilament testing bilaterally: Yes  Pulse Check  See comments: Yes  Comments   Lab Results  Component Value Date   WBC 6.6 07/18/2017   HGB 12.9 (L) 07/18/2017   HCT 38.1 (L) 07/18/2017   PLT 333 07/18/2017   GLUCOSE 137 (H) 07/18/2017   CHOL 159 07/18/2017   TRIG 112.0 07/18/2017   HDL 34.20 (L) 07/18/2017   LDLCALC 102 (H) 07/18/2017   ALT 18 07/18/2017   AST 15 07/18/2017   NA 143 07/18/2017   K 3.8 07/18/2017   CL 96 07/18/2017   CREATININE 0.84 07/18/2017   BUN 13 07/18/2017   CO2 34 (H) 07/18/2017   TSH 1.36 07/18/2017   PSA 2.73 07/18/2017   HGBA1C 7.2 10/21/2017   MICROALBUR 1.5 07/18/2017    Patient was never admitted.  Assessment & Plan:   [name] was seen today for follow-up.  Diagnoses and all orders for this visit:  Type 2 diabetes mellitus with hyperglycemia, without long-term current use of insulin (HCC) -     POC HgB A1c -     metFORMIN (GLUCOPHAGE) 1000 MG tablet; Take 1 tablet (1,000 mg total) by mouth 2 (two) times daily. -     CBC -     Microalbumin / creatinine urine ratio -     Ambulatory referral to Ophthalmology  ASCVD (arteriosclerotic cardiovascular disease) -     amLODipine (NORVASC) 5 MG tablet; Take 1 tablet (5 mg total) by mouth daily. -     aspirin 81 MG chewable tablet; Chew 1 tablet (81 mg total) by mouth daily. -     pravastatin (PRAVACHOL) 20 MG tablet; Take 1 tablet (20 mg total) by mouth at bedtime. -     metoprolol succinate (TOPROL-XL) 100 MG 24 hr tablet; Take 1 tablet (100 mg total) by mouth daily. Take with or immediately following a meal. -     Lipid  panel  Elevated LDL cholesterol level -     aspirin 81 MG chewable tablet; Chew 1 tablet (81 mg total) by mouth daily. -     pravastatin (PRAVACHOL) 20 MG tablet; Take 1 tablet (20 mg total) by mouth at bedtime. -     Lipid panel  Essential hypertension -     amLODipine (NORVASC) 5 MG tablet; Take 1 tablet (5 mg total) by mouth daily. -     aspirin 81 MG chewable tablet; Chew  1 tablet (81 mg total) by mouth daily. -     chlorthalidone (HYGROTON) 25 MG tablet; Take 1 tablet (25 mg total) by mouth daily. -     metoprolol succinate (TOPROL-XL) 100 MG 24 hr tablet; Take 1 tablet (100 mg total) by mouth daily. Take with or immediately following a meal. -     CBC -     Microalbumin / creatinine urine ratio  Anemia, unspecified type -     CBC   I have discontinued Josedaniel Going's dapagliflozin propanediol. I have also changed his amLODipine, aspirin, chlorthalidone, and metFORMIN. Additionally, I am having him start on metoprolol succinate. Lastly, I am having him maintain his folic acid, methotrexate, omeprazole, predniSONE, metoprolol tartrate, gabapentin, and pravastatin.  Meds ordered this encounter  Medications  . amLODipine (NORVASC) 5 MG tablet    Sig: Take 1 tablet (5 mg total) by mouth daily.    Dispense:  100 tablet    Refill:  2  . aspirin 81 MG chewable tablet    Sig: Chew 1 tablet (81 mg total) by mouth daily.    Dispense:  365 tablet    Refill:  1  . chlorthalidone (HYGROTON) 25 MG tablet    Sig: Take 1 tablet (25 mg total) by mouth daily.    Dispense:  100 tablet    Refill:  2  . metFORMIN (GLUCOPHAGE) 1000 MG tablet    Sig: Take 1 tablet (1,000 mg total) by mouth 2 (two) times daily.    Dispense:  180 tablet    Refill:  2  . pravastatin (PRAVACHOL) 20 MG tablet    Sig: Take 1 tablet (20 mg total) by mouth at bedtime.    Dispense:  90 tablet    Refill:  2  . metoprolol succinate (TOPROL-XL) 100 MG 24 hr tablet    Sig: Take 1 tablet (100 mg total) by mouth daily.  Take with or immediately following a meal.    Dispense:  90 tablet    Refill:  3   Hemoglobin A1c is actually improved without the [place].  Encourage patient to continue the metformin exercise and try to lose some weight.  90-day supplies of medicine were given he will follow-up in 6 months or sooner as needed.  Follow-up: Return in about 6 months (around 04/23/2018).  Libby Maw, MD

## 2017-10-21 NOTE — Patient Instructions (Signed)

## 2017-10-22 LAB — CBC
HCT: 34 % — ABNORMAL LOW (ref 38.5–50.0)
Hemoglobin: 11.8 g/dL — ABNORMAL LOW (ref 13.2–17.1)
MCH: 29.5 pg (ref 27.0–33.0)
MCHC: 34.7 g/dL (ref 32.0–36.0)
MCV: 85 fL (ref 80.0–100.0)
MPV: 11 fL (ref 7.5–12.5)
Platelets: 331 10*3/uL (ref 140–400)
RBC: 4 10*6/uL — AB (ref 4.20–5.80)
RDW: 14.7 % (ref 11.0–15.0)
WBC: 6.5 10*3/uL (ref 3.8–10.8)

## 2017-10-22 LAB — LIPID PANEL
CHOL/HDL RATIO: 2.8 (calc) (ref <5.0)
CHOLESTEROL: 107 mg/dL (ref <200)
HDL: 38 mg/dL — ABNORMAL LOW (ref >40)
LDL Cholesterol (Calc): 51 mg/dL (calc)
NON-HDL CHOLESTEROL (CALC): 69 mg/dL (ref <130)
Triglycerides: 93 mg/dL (ref <150)

## 2017-10-22 LAB — MICROALBUMIN / CREATININE URINE RATIO
Creatinine, Urine: 177 mg/dL (ref 20–320)
MICROALB UR: 1.9 mg/dL
MICROALB/CREAT RATIO: 11 ug/mg{creat} (ref <30)

## 2017-10-25 ENCOUNTER — Telehealth: Payer: Self-pay | Admitting: Family Medicine

## 2017-10-25 ENCOUNTER — Encounter: Payer: Self-pay | Admitting: Internal Medicine

## 2017-10-25 DIAGNOSIS — I10 Essential (primary) hypertension: Secondary | ICD-10-CM

## 2017-10-25 DIAGNOSIS — I2510 Atherosclerotic heart disease of native coronary artery without angina pectoris: Secondary | ICD-10-CM

## 2017-10-25 NOTE — Telephone Encounter (Signed)
Copied from [place] [id number]. Topic: Quick Communication - Rx Refill/Question >> Oct 25, 2017 10:56 AM Neva Seat wrote: metoprolol succinate (TOPROL-XL) 100 MG 24 hr tablet  Pt needing his new Rx filled at:  [facility] [place] [place] [address] Phone: [phone] Fax: [phone]

## 2017-10-25 NOTE — Telephone Encounter (Signed)
Pt requesting refill of Metoprolol, previously filled by historical provider.   LOV: 10/21/17 Dr. Wilder Glade [facility]   [place]

## 2017-10-26 MED ORDER — METOPROLOL SUCCINATE ER 100 MG PO TB24: 100.0000 mg | ORAL_TABLET | Freq: Every day | ORAL | 3 refills | Status: DC

## 2017-10-26 NOTE — Telephone Encounter (Signed)
Rx sent in

## 2017-10-28 ENCOUNTER — Ambulatory Visit: Payer: Medicare HMO | Admitting: Family Medicine

## 2017-11-07 ENCOUNTER — Encounter (INDEPENDENT_AMBULATORY_CARE_PROVIDER_SITE_OTHER): Payer: Medicare HMO | Admitting: Ophthalmology

## 2017-11-10 DIAGNOSIS — M0579 Rheumatoid arthritis with rheumatoid factor of multiple sites without organ or systems involvement: Secondary | ICD-10-CM | POA: Diagnosis not present

## 2017-11-10 DIAGNOSIS — Z79899 Other long term (current) drug therapy: Secondary | ICD-10-CM | POA: Diagnosis not present

## 2017-11-12 DIAGNOSIS — Z79899 Other long term (current) drug therapy: Secondary | ICD-10-CM | POA: Diagnosis not present

## 2017-11-12 DIAGNOSIS — M0579 Rheumatoid arthritis with rheumatoid factor of multiple sites without organ or systems involvement: Secondary | ICD-10-CM | POA: Diagnosis not present

## 2017-11-18 ENCOUNTER — Encounter: Payer: Medicare HMO | Admitting: Internal Medicine

## 2017-11-18 DIAGNOSIS — R55 Syncope and collapse: Secondary | ICD-10-CM | POA: Diagnosis not present

## 2017-12-07 DIAGNOSIS — I10 Essential (primary) hypertension: Secondary | ICD-10-CM | POA: Diagnosis not present

## 2017-12-07 DIAGNOSIS — I2510 Atherosclerotic heart disease of native coronary artery without angina pectoris: Secondary | ICD-10-CM | POA: Diagnosis not present

## 2017-12-07 DIAGNOSIS — Z87891 Personal history of nicotine dependence: Secondary | ICD-10-CM | POA: Diagnosis not present

## 2017-12-07 DIAGNOSIS — M0579 Rheumatoid arthritis with rheumatoid factor of multiple sites without organ or systems involvement: Secondary | ICD-10-CM | POA: Diagnosis not present

## 2017-12-07 DIAGNOSIS — E782 Mixed hyperlipidemia: Secondary | ICD-10-CM | POA: Diagnosis not present

## 2017-12-07 DIAGNOSIS — Z7982 Long term (current) use of aspirin: Secondary | ICD-10-CM | POA: Diagnosis not present

## 2018-02-10 DIAGNOSIS — M0579 Rheumatoid arthritis with rheumatoid factor of multiple sites without organ or systems involvement: Secondary | ICD-10-CM | POA: Diagnosis not present

## 2018-02-10 DIAGNOSIS — Z79899 Other long term (current) drug therapy: Secondary | ICD-10-CM | POA: Diagnosis not present

## 2018-03-05 ENCOUNTER — Other Ambulatory Visit: Payer: Self-pay | Admitting: Family Medicine

## 2018-03-31 ENCOUNTER — Other Ambulatory Visit: Payer: Self-pay

## 2018-03-31 MED ORDER — OMEPRAZOLE 20 MG PO CPDR: 20.0000 mg | DELAYED_RELEASE_CAPSULE | Freq: Every day | ORAL | 1 refills | Status: DC

## 2018-04-08 DIAGNOSIS — R69 Illness, unspecified: Secondary | ICD-10-CM | POA: Diagnosis not present

## 2018-04-24 ENCOUNTER — Encounter: Payer: Self-pay | Admitting: Family Medicine

## 2018-04-24 ENCOUNTER — Ambulatory Visit (INDEPENDENT_AMBULATORY_CARE_PROVIDER_SITE_OTHER): Payer: Medicare HMO | Admitting: Family Medicine

## 2018-04-24 VITALS — BP 130/80 | HR 90 | Ht 69.0 in | Wt 251.4 lb

## 2018-04-24 DIAGNOSIS — I2510 Atherosclerotic heart disease of native coronary artery without angina pectoris: Secondary | ICD-10-CM | POA: Diagnosis not present

## 2018-04-24 DIAGNOSIS — I10 Essential (primary) hypertension: Secondary | ICD-10-CM

## 2018-04-24 DIAGNOSIS — D649 Anemia, unspecified: Secondary | ICD-10-CM

## 2018-04-24 DIAGNOSIS — E7800 Pure hypercholesterolemia, unspecified: Secondary | ICD-10-CM | POA: Diagnosis not present

## 2018-04-24 DIAGNOSIS — Z1211 Encounter for screening for malignant neoplasm of colon: Secondary | ICD-10-CM

## 2018-04-24 DIAGNOSIS — E1165 Type 2 diabetes mellitus with hyperglycemia: Secondary | ICD-10-CM

## 2018-04-24 LAB — COMPREHENSIVE METABOLIC PANEL
ALBUMIN: 4.2 g/dL (ref 3.5–5.2)
ALT: 17 U/L (ref 0–53)
AST: 17 U/L (ref 0–37)
Alkaline Phosphatase: 57 U/L (ref 39–117)
BUN: 16 mg/dL (ref 6–23)
CALCIUM: 9.9 mg/dL (ref 8.4–10.5)
CHLORIDE: 98 meq/L (ref 96–112)
CO2: 30 meq/L (ref 19–32)
CREATININE: 1 mg/dL (ref 0.40–1.50)
GFR: 78.47 mL/min (ref >60.00)
Glucose, Bld: 112 mg/dL — ABNORMAL HIGH (ref 70–99)
POTASSIUM: 3.6 meq/L (ref 3.5–5.1)
Sodium: 141 mEq/L (ref 135–145)
Total Bilirubin: 0.3 mg/dL (ref 0.2–1.2)
Total Protein: 7.4 g/dL (ref 6.0–8.3)

## 2018-04-24 LAB — URINALYSIS, ROUTINE W REFLEX MICROSCOPIC
Bilirubin Urine: NEGATIVE
Hgb urine dipstick: NEGATIVE
Leukocytes, UA: NEGATIVE
Nitrite: NEGATIVE
PH: 5.5 (ref 5.0–8.0)
RBC / HPF: NONE SEEN (ref 0–?)
Total Protein, Urine: NEGATIVE
UROBILINOGEN UA: 1 (ref 0.0–1.0)
Urine Glucose: NEGATIVE

## 2018-04-24 LAB — HEMOGLOBIN A1C: Hgb A1c MFr Bld: 6.8 % — ABNORMAL HIGH (ref 4.6–6.5)

## 2018-04-24 LAB — CBC
HEMATOCRIT: 37.6 % — AB (ref 39.0–52.0)
HEMOGLOBIN: 12.5 g/dL — AB (ref 13.0–17.0)
MCHC: 33.3 g/dL (ref 30.0–36.0)
MCV: 87.4 fl (ref 78.0–100.0)
PLATELETS: 352 10*3/uL (ref 150.0–400.0)
RBC: 4.3 Mil/uL (ref 4.22–5.81)
RDW: 16 % — ABNORMAL HIGH (ref 11.5–15.5)
WBC: 5.9 10*3/uL (ref 4.0–10.5)

## 2018-04-24 LAB — LIPID PANEL
CHOL/HDL RATIO: 3
Cholesterol: 100 mg/dL (ref 0–200)
HDL: 36 mg/dL — ABNORMAL LOW (ref >39.00)
LDL CALC: 43 mg/dL (ref 0–99)
NonHDL: 63.5
TRIGLYCERIDES: 105 mg/dL (ref 0.0–149.0)
VLDL: 21 mg/dL (ref 0.0–40.0)

## 2018-04-24 LAB — VITAMIN B12: Vitamin B-12: 314 pg/mL (ref 211–911)

## 2018-04-24 MED ORDER — AMLODIPINE BESYLATE 5 MG PO TABS: 5.0000 mg | ORAL_TABLET | Freq: Every day | ORAL | 2 refills | Status: DC

## 2018-04-24 MED ORDER — METFORMIN HCL 1000 MG PO TABS: 1000.0000 mg | ORAL_TABLET | Freq: Two times a day (BID) | ORAL | 2 refills | Status: DC

## 2018-04-24 MED ORDER — ASPIRIN 81 MG PO CHEW: 81.0000 mg | CHEWABLE_TABLET | Freq: Every day | ORAL | 1 refills | Status: AC

## 2018-04-24 MED ORDER — PRAVASTATIN SODIUM 20 MG PO TABS: 20.0000 mg | ORAL_TABLET | Freq: Every day | ORAL | 2 refills | Status: DC

## 2018-04-24 MED ORDER — CHLORTHALIDONE 25 MG PO TABS: 25.0000 mg | ORAL_TABLET | Freq: Every day | ORAL | 2 refills | Status: DC

## 2018-04-24 MED ORDER — METOPROLOL SUCCINATE ER 100 MG PO TB24: 100.0000 mg | ORAL_TABLET | Freq: Every day | ORAL | 3 refills | Status: DC

## 2018-04-24 NOTE — Progress Notes (Signed)
Subjective:  Patient ID: Mitchell Rose, male    DOB: March 03, 1946  Age: 72 y.o. MRN: [id number]  CC: Follow-up   HPI Mitchell Rose presents for follow-up of his hypertension diabetes and elevated ldl cholesterol normocytic anemia.  Patient has not been able to go for colonoscopy because his wife is not comfortable driving him over to [place].  He is seeing no blood in his stool or urine.  Denies melena.  He is taking a multivitamin without iron.  Blood pressure is been controlled with chlorthalidone and metoprolol succinate.  He continues to take high-dose metformin thousand milligrams twice daily without issue.  He has not seen an eye doctor recently.  He is having no chest pain or shortness of breath at this time.  Outpatient Medications Prior to Visit  Medication Sig Dispense Refill  . folic acid (FOLVITE) 1 MG tablet Take 1 tablet by mouth daily.    Marland Kitchen gabapentin (NEURONTIN) 300 MG capsule TAKE ONE CAPSULE BY MOUTH AT BEDTIME 90 capsule 0  . methotrexate (RHEUMATREX) 2.5 MG tablet Take 7 tablets by mouth once a week.    Marland Kitchen omeprazole (PRILOSEC) 20 MG capsule Take 1 capsule (20 mg total) by mouth daily. 90 capsule 1  . predniSONE (DELTASONE) 5 MG tablet Take 1 tablet by mouth daily.    Marland Kitchen amLODipine (NORVASC) 5 MG tablet Take 1 tablet (5 mg total) by mouth daily. 100 tablet 2  . aspirin 81 MG chewable tablet Chew 1 tablet (81 mg total) by mouth daily. 365 tablet 1  . chlorthalidone (HYGROTON) 25 MG tablet Take 1 tablet (25 mg total) by mouth daily. 100 tablet 2  . metFORMIN (GLUCOPHAGE) 1000 MG tablet Take 1 tablet (1,000 mg total) by mouth 2 (two) times daily. 180 tablet 2  . metoprolol succinate (TOPROL-XL) 100 MG 24 hr tablet Take 1 tablet (100 mg total) by mouth daily. Take with or immediately following a meal. 90 tablet 3  . metoprolol tartrate (LOPRESSOR) 100 MG tablet Take 100 mg by mouth 2 (two) times daily.    . pravastatin (PRAVACHOL) 20 MG tablet Take 1 tablet (20 mg total) by  mouth at bedtime. 90 tablet 2   No facility-administered medications prior to visit.     ROS Review of Systems  Constitutional: Negative.   HENT: Negative.   Eyes: Negative for photophobia and visual disturbance.  Respiratory: Negative.   Cardiovascular: Negative.   Gastrointestinal: Negative.   Endocrine: Negative for polyphagia and polyuria.  Genitourinary: Negative for decreased urine volume and difficulty urinating.  Musculoskeletal: Positive for arthralgias.  Skin: Negative for pallor and rash.  Allergic/Immunologic: Negative for immunocompromised state.  Neurological: Negative for speech difficulty, numbness and headaches.  Hematological: Negative.   Psychiatric/Behavioral: Negative.     Objective:  BP 130/80 (BP Location: Left Arm, Patient Position: Sitting, Cuff Size: Normal)   Pulse 90   Ht 5\' 9"  (1.753 m)   Wt 251 lb 6 oz (114 kg)   SpO2 98%   BMI 37.12 kg/m   BP Readings from Last 3 Encounters:  04/24/18 130/80  10/21/17 118/82  07/22/17 130/80    Wt Readings from Last 3 Encounters:  04/24/18 251 lb 6 oz (114 kg)  10/21/17 258 lb 6 oz (117.2 kg)  07/22/17 265 lb (120.2 kg)    Physical Exam  Constitutional: He is oriented to person, place, and time. He appears well-developed and well-nourished. No distress.  HENT:  Head: Normocephalic and atraumatic.  Right Ear: External ear normal.  Left  Ear: External ear normal.  Mouth/Throat: Oropharynx is clear and moist. No oropharyngeal exudate.  Eyes: Pupils are equal, round, and reactive to light. Conjunctivae and EOM are normal. Right eye exhibits no discharge. Left eye exhibits no discharge. No scleral icterus.  Neck: Neck supple. No JVD present. No tracheal deviation present. No thyromegaly present.  Cardiovascular: Normal rate, regular rhythm and normal heart sounds.  Pulmonary/Chest: Effort normal and breath sounds normal.  Lymphadenopathy:    He has no cervical adenopathy.  Neurological: He is alert and  oriented to person, place, and time.  Skin: He is not diaphoretic.  Psychiatric: He has a normal mood and affect. His behavior is normal.    Lab Results  Component Value Date   WBC 6.5 10/21/2017   HGB 11.8 (L) 10/21/2017   HCT 34.0 (L) 10/21/2017   PLT 331 10/21/2017   GLUCOSE 137 (H) 07/18/2017   CHOL 107 10/21/2017   TRIG 93 10/21/2017   HDL 38 (L) 10/21/2017   LDLCALC 51 10/21/2017   ALT 18 07/18/2017   AST 15 07/18/2017   NA 143 07/18/2017   K 3.8 07/18/2017   CL 96 07/18/2017   CREATININE 0.84 07/18/2017   BUN 13 07/18/2017   CO2 34 (H) 07/18/2017   TSH 1.36 07/18/2017   PSA 2.73 07/18/2017   HGBA1C 7.2 10/21/2017   MICROALBUR 1.9 10/21/2017    Patient was never admitted.  Assessment & Plan:   [name] was seen today for follow-up.  Diagnoses and all orders for this visit:  Essential hypertension -     CBC -     Comprehensive metabolic panel -     Urinalysis, Routine w reflex microscopic -     amLODipine (NORVASC) 5 MG tablet; Take 1 tablet (5 mg total) by mouth daily. -     aspirin 81 MG chewable tablet; Chew 1 tablet (81 mg total) by mouth daily. -     chlorthalidone (HYGROTON) 25 MG tablet; Take 1 tablet (25 mg total) by mouth daily. -     metoprolol succinate (TOPROL-XL) 100 MG 24 hr tablet; Take 1 tablet (100 mg total) by mouth daily. Take with or immediately following a meal.  ASCVD (arteriosclerotic cardiovascular disease) -     Comprehensive metabolic panel -     amLODipine (NORVASC) 5 MG tablet; Take 1 tablet (5 mg total) by mouth daily. -     aspirin 81 MG chewable tablet; Chew 1 tablet (81 mg total) by mouth daily. -     metoprolol succinate (TOPROL-XL) 100 MG 24 hr tablet; Take 1 tablet (100 mg total) by mouth daily. Take with or immediately following a meal. -     pravastatin (PRAVACHOL) 20 MG tablet; Take 1 tablet (20 mg total) by mouth at bedtime.  Type 2 diabetes mellitus with hyperglycemia, without long-term current use of insulin (HCC) -      Urinalysis, Routine w reflex microscopic -     Hemoglobin A1c -     metFORMIN (GLUCOPHAGE) 1000 MG tablet; Take 1 tablet (1,000 mg total) by mouth 2 (two) times daily.  Screen for colon cancer -     Ambulatory referral to Gastroenterology  Normocytic anemia -     CBC -     Iron, TIBC and Ferritin Panel -     Vitamin B12 -     Urinalysis, Routine w reflex microscopic -     Ambulatory referral to Gastroenterology  Elevated LDL cholesterol level -     Comprehensive metabolic  panel -     Lipid panel -     aspirin 81 MG chewable tablet; Chew 1 tablet (81 mg total) by mouth daily. -     pravastatin (PRAVACHOL) 20 MG tablet; Take 1 tablet (20 mg total) by mouth at bedtime.   I have discontinued Khristopher Dutt's metoprolol tartrate. I am also having him maintain his folic acid, methotrexate, predniSONE, gabapentin, omeprazole, amLODipine, aspirin, chlorthalidone, metFORMIN, metoprolol succinate, and pravastatin.  Meds ordered this encounter  Medications  . amLODipine (NORVASC) 5 MG tablet    Sig: Take 1 tablet (5 mg total) by mouth daily.    Dispense:  100 tablet    Refill:  2  . aspirin 81 MG chewable tablet    Sig: Chew 1 tablet (81 mg total) by mouth daily.    Dispense:  365 tablet    Refill:  1  . chlorthalidone (HYGROTON) 25 MG tablet    Sig: Take 1 tablet (25 mg total) by mouth daily.    Dispense:  100 tablet    Refill:  2  . metFORMIN (GLUCOPHAGE) 1000 MG tablet    Sig: Take 1 tablet (1,000 mg total) by mouth 2 (two) times daily.    Dispense:  180 tablet    Refill:  2  . metoprolol succinate (TOPROL-XL) 100 MG 24 hr tablet    Sig: Take 1 tablet (100 mg total) by mouth daily. Take with or immediately following a meal.    Dispense:  90 tablet    Refill:  3  . pravastatin (PRAVACHOL) 20 MG tablet    Sig: Take 1 tablet (20 mg total) by mouth at bedtime.    Dispense:  90 tablet    Refill:  2     Follow-up: Return in about 6 months (around 10/23/2018).  Libby Maw, MD

## 2018-04-25 ENCOUNTER — Encounter: Payer: Self-pay | Admitting: Family Medicine

## 2018-04-25 LAB — IRON,TIBC AND FERRITIN PANEL
%SAT: 10 % — AB (ref 20–48)
FERRITIN: 67 ng/mL (ref 24–380)
IRON: 34 ug/dL — AB (ref 50–180)
TIBC: 341 ug/dL (ref 250–425)

## 2018-04-25 MED ORDER — AMLODIPINE BESYLATE 10 MG PO TABS: 10.0000 mg | ORAL_TABLET | Freq: Every day | ORAL | 2 refills | Status: DC

## 2018-04-25 NOTE — Addendum Note (Signed)
Addended by: Kateri Mc E on: 04/25/2018 01:34 PM   Modules accepted: Orders

## 2018-05-12 DIAGNOSIS — M0579 Rheumatoid arthritis with rheumatoid factor of multiple sites without organ or systems involvement: Secondary | ICD-10-CM | POA: Diagnosis not present

## 2018-05-12 DIAGNOSIS — Z79899 Other long term (current) drug therapy: Secondary | ICD-10-CM | POA: Diagnosis not present

## 2018-05-16 DIAGNOSIS — D500 Iron deficiency anemia secondary to blood loss (chronic): Secondary | ICD-10-CM | POA: Diagnosis not present

## 2018-06-01 ENCOUNTER — Other Ambulatory Visit: Payer: Self-pay | Admitting: Family Medicine

## 2018-06-16 DIAGNOSIS — H35033 Hypertensive retinopathy, bilateral: Secondary | ICD-10-CM | POA: Diagnosis not present

## 2018-06-16 DIAGNOSIS — H04123 Dry eye syndrome of bilateral lacrimal glands: Secondary | ICD-10-CM | POA: Diagnosis not present

## 2018-06-16 DIAGNOSIS — E119 Type 2 diabetes mellitus without complications: Secondary | ICD-10-CM | POA: Diagnosis not present

## 2018-06-16 DIAGNOSIS — H2513 Age-related nuclear cataract, bilateral: Secondary | ICD-10-CM | POA: Diagnosis not present

## 2018-06-16 LAB — HM DIABETES EYE EXAM

## 2018-06-21 DIAGNOSIS — M0579 Rheumatoid arthritis with rheumatoid factor of multiple sites without organ or systems involvement: Secondary | ICD-10-CM | POA: Diagnosis not present

## 2018-06-21 DIAGNOSIS — Z7982 Long term (current) use of aspirin: Secondary | ICD-10-CM | POA: Diagnosis not present

## 2018-06-21 DIAGNOSIS — I2510 Atherosclerotic heart disease of native coronary artery without angina pectoris: Secondary | ICD-10-CM | POA: Diagnosis not present

## 2018-06-21 DIAGNOSIS — I10 Essential (primary) hypertension: Secondary | ICD-10-CM | POA: Diagnosis not present

## 2018-06-21 DIAGNOSIS — Z87891 Personal history of nicotine dependence: Secondary | ICD-10-CM | POA: Diagnosis not present

## 2018-06-21 DIAGNOSIS — E782 Mixed hyperlipidemia: Secondary | ICD-10-CM | POA: Diagnosis not present

## 2018-06-28 ENCOUNTER — Encounter: Payer: Self-pay | Admitting: Family Medicine

## 2018-06-29 DIAGNOSIS — R143 Flatulence: Secondary | ICD-10-CM | POA: Diagnosis not present

## 2018-06-29 DIAGNOSIS — D500 Iron deficiency anemia secondary to blood loss (chronic): Secondary | ICD-10-CM | POA: Diagnosis not present

## 2018-06-29 DIAGNOSIS — R141 Gas pain: Secondary | ICD-10-CM | POA: Diagnosis not present

## 2018-06-29 DIAGNOSIS — D123 Benign neoplasm of transverse colon: Secondary | ICD-10-CM | POA: Diagnosis not present

## 2018-06-29 DIAGNOSIS — K5730 Diverticulosis of large intestine without perforation or abscess without bleeding: Secondary | ICD-10-CM | POA: Diagnosis not present

## 2018-06-29 DIAGNOSIS — K648 Other hemorrhoids: Secondary | ICD-10-CM | POA: Diagnosis not present

## 2018-06-29 DIAGNOSIS — K2930 Chronic superficial gastritis without bleeding: Secondary | ICD-10-CM | POA: Diagnosis not present

## 2018-06-29 DIAGNOSIS — K2950 Unspecified chronic gastritis without bleeding: Secondary | ICD-10-CM | POA: Diagnosis not present

## 2018-06-29 DIAGNOSIS — R142 Eructation: Secondary | ICD-10-CM | POA: Diagnosis not present

## 2018-06-29 DIAGNOSIS — R140 Abdominal distension (gaseous): Secondary | ICD-10-CM | POA: Diagnosis not present

## 2018-08-11 DIAGNOSIS — M0579 Rheumatoid arthritis with rheumatoid factor of multiple sites without organ or systems involvement: Secondary | ICD-10-CM | POA: Diagnosis not present

## 2018-08-11 DIAGNOSIS — Z79899 Other long term (current) drug therapy: Secondary | ICD-10-CM | POA: Diagnosis not present

## 2018-09-26 ENCOUNTER — Encounter: Payer: Self-pay | Admitting: Family Medicine

## 2018-09-26 ENCOUNTER — Encounter (HOSPITAL_COMMUNITY): Payer: Self-pay | Admitting: Emergency Medicine

## 2018-09-26 ENCOUNTER — Other Ambulatory Visit: Payer: Self-pay

## 2018-09-26 ENCOUNTER — Ambulatory Visit: Payer: Self-pay | Admitting: Family Medicine

## 2018-09-26 ENCOUNTER — Emergency Department (HOSPITAL_COMMUNITY)
Admission: EM | Admit: 2018-09-26 | Discharge: 2018-09-26 | Disposition: A | Discharge status: Home / Self Care | Payer: Medicare HMO | Attending: Emergency Medicine | Admitting: Emergency Medicine

## 2018-09-26 ENCOUNTER — Ambulatory Visit (INDEPENDENT_AMBULATORY_CARE_PROVIDER_SITE_OTHER): Payer: Medicare HMO | Admitting: Family Medicine

## 2018-09-26 ENCOUNTER — Emergency Department (HOSPITAL_COMMUNITY): Payer: Medicare HMO

## 2018-09-26 VITALS — Ht 69.0 in

## 2018-09-26 DIAGNOSIS — R05 Cough: Secondary | ICD-10-CM | POA: Diagnosis not present

## 2018-09-26 DIAGNOSIS — E119 Type 2 diabetes mellitus without complications: Secondary | ICD-10-CM | POA: Diagnosis not present

## 2018-09-26 DIAGNOSIS — Z87891 Personal history of nicotine dependence: Secondary | ICD-10-CM | POA: Diagnosis not present

## 2018-09-26 DIAGNOSIS — Z7982 Long term (current) use of aspirin: Secondary | ICD-10-CM | POA: Insufficient documentation

## 2018-09-26 DIAGNOSIS — R0602 Shortness of breath: Secondary | ICD-10-CM | POA: Insufficient documentation

## 2018-09-26 DIAGNOSIS — B349 Viral infection, unspecified: Secondary | ICD-10-CM

## 2018-09-26 DIAGNOSIS — I10 Essential (primary) hypertension: Secondary | ICD-10-CM | POA: Insufficient documentation

## 2018-09-26 DIAGNOSIS — R439 Unspecified disturbances of smell and taste: Secondary | ICD-10-CM | POA: Diagnosis not present

## 2018-09-26 DIAGNOSIS — Z79899 Other long term (current) drug therapy: Secondary | ICD-10-CM | POA: Insufficient documentation

## 2018-09-26 DIAGNOSIS — B342 Coronavirus infection, unspecified: Secondary | ICD-10-CM | POA: Diagnosis not present

## 2018-09-26 DIAGNOSIS — Z7984 Long term (current) use of oral hypoglycemic drugs: Secondary | ICD-10-CM | POA: Insufficient documentation

## 2018-09-26 DIAGNOSIS — I259 Chronic ischemic heart disease, unspecified: Secondary | ICD-10-CM | POA: Diagnosis not present

## 2018-09-26 HISTORY — DX: Type 2 diabetes mellitus without complications: E11.9

## 2018-09-26 HISTORY — DX: Essential (primary) hypertension: I10

## 2018-09-26 LAB — COMPREHENSIVE METABOLIC PANEL
ALT: 47 U/L — ABNORMAL HIGH (ref 0–44)
AST: 58 U/L — ABNORMAL HIGH (ref 15–41)
Albumin: 3.7 g/dL (ref 3.5–5.0)
Alkaline Phosphatase: 57 U/L (ref 38–126)
Anion gap: 16 — ABNORMAL HIGH (ref 5–15)
BUN: 34 mg/dL — ABNORMAL HIGH (ref 8–23)
CO2: 26 mmol/L (ref 22–32)
Calcium: 8.9 mg/dL (ref 8.9–10.3)
Chloride: 94 mmol/L — ABNORMAL LOW (ref 98–111)
Creatinine, Ser: 1.61 mg/dL — ABNORMAL HIGH (ref 0.61–1.24)
GFR calc Af Amer: 49 mL/min — ABNORMAL LOW (ref >60)
GFR calc non Af Amer: 43 mL/min — ABNORMAL LOW (ref >60)
Glucose, Bld: 125 mg/dL — ABNORMAL HIGH (ref 70–99)
Potassium: 3 mmol/L — ABNORMAL LOW (ref 3.5–5.1)
Sodium: 136 mmol/L (ref 135–145)
Total Bilirubin: 0.9 mg/dL (ref 0.3–1.2)
Total Protein: 8.4 g/dL — ABNORMAL HIGH (ref 6.5–8.1)

## 2018-09-26 LAB — CBC WITH DIFFERENTIAL/PLATELET
Abs Immature Granulocytes: 0.03 10*3/uL (ref 0.00–0.07)
Basophils Absolute: 0 10*3/uL (ref 0.0–0.1)
Basophils Relative: 0 %
Eosinophils Absolute: 0.2 10*3/uL (ref 0.0–0.5)
Eosinophils Relative: 3 %
HCT: 41.7 % (ref 39.0–52.0)
Hemoglobin: 13.4 g/dL (ref 13.0–17.0)
Immature Granulocytes: 1 %
Lymphocytes Relative: 20 %
Lymphs Abs: 1.2 10*3/uL (ref 0.7–4.0)
MCH: 28.2 pg (ref 26.0–34.0)
MCHC: 32.1 g/dL (ref 30.0–36.0)
MCV: 87.8 fL (ref 80.0–100.0)
Monocytes Absolute: 0.1 10*3/uL (ref 0.1–1.0)
Monocytes Relative: 2 %
Neutro Abs: 4.4 10*3/uL (ref 1.7–7.7)
Neutrophils Relative %: 74 %
Platelets: 249 10*3/uL (ref 150–400)
RBC: 4.75 MIL/uL (ref 4.22–5.81)
RDW: 15.3 % (ref 11.5–15.5)
WBC: 5.9 10*3/uL (ref 4.0–10.5)
nRBC: 0 % (ref 0.0–0.2)

## 2018-09-26 LAB — LACTIC ACID, PLASMA: Lactic Acid, Venous: 1.9 mmol/L (ref 0.5–1.9)

## 2018-09-26 LAB — SARS CORONAVIRUS 2 BY RT PCR (HOSPITAL ORDER, PERFORMED IN ~~LOC~~ HOSPITAL LAB): SARS Coronavirus 2: POSITIVE — AB

## 2018-09-26 MED ORDER — SODIUM CHLORIDE 0.9 % IV BOLUS
1000.0000 mL | Freq: Once | INTRAVENOUS | Status: AC
  Administered 2018-09-26: 1000 mL via INTRAVENOUS

## 2018-09-26 NOTE — Progress Notes (Addendum)
Established Patient Office Visit  Subjective:  Patient ID: Mitchell Rose, male    DOB: May 13, 1946  Age: 73 y.o. MRN: [id number]  CC:  Chief Complaint  Patient presents with  . Cough  . Fever    102.2  . Shortness of Breath  . Chills    HPI Mitchell Rose presents for evaluation and treatment for an acute febrile illness.  His wife is speaking on his behalf.  History of a 3 days of fevers and chills malaise fatigue postnasal drip sore throat cough with shortness of breath.  EMS was summoned to the house yesterday and then measured his temperature at 102.1.  They were instructed to call EMS back if he developed any shortness of breath or difficulty breathing.  Patient is weak and has had difficulty getting out of the bed according to his wife.  He has multiple comorbidities to include hypertension, vascular disease, diabetes and rheumatoid arthritis.  He continues to take prednisone on a chronic basis.  Past Medical History:  Diagnosis Date  . Diabetes mellitus without complication ([place])   . Hypertension     Past Surgical History:  Procedure Laterality Date  . HERNIA REPAIR      History reviewed. No pertinent family history.  Social History   Socioeconomic History  . Marital status: Married    Spouse name: Not on file  . Number of children: Not on file  . Years of education: Not on file  . Highest education level: Not on file  Occupational History  . Not on file  Social Needs  . Financial resource strain: Not on file  . Food insecurity:    Worry: Not on file    Inability: Not on file  . Transportation needs:    Medical: Not on file    Non-medical: Not on file  Tobacco Use  . Smoking status: Former Research scientist (life sciences)  . Smokeless tobacco: Never Used  Substance and Sexual Activity  . Alcohol use: Not Currently  . Drug use: Not on file  . Sexual activity: Not on file  Lifestyle  . Physical activity:    Days per week: Not on file    Minutes per session: Not on file  .  Stress: Not on file  Relationships  . Social connections:    Talks on phone: Not on file    Gets together: Not on file    Attends religious service: Not on file    Active member of club or organization: Not on file    Attends meetings of clubs or organizations: Not on file    Relationship status: Not on file  . Intimate partner violence:    Fear of current or ex partner: Not on file    Emotionally abused: Not on file    Physically abused: Not on file    Forced sexual activity: Not on file  Other Topics Concern  . Not on file  Social History Narrative  . Not on file    Outpatient Medications Prior to Visit  Medication Sig Dispense Refill  . amLODipine (NORVASC) 10 MG tablet Take 1 tablet (10 mg total) by mouth daily. 90 tablet 2  . aspirin 81 MG chewable tablet Chew 1 tablet (81 mg total) by mouth daily. 365 tablet 1  . chlorthalidone (HYGROTON) 25 MG tablet Take 1 tablet (25 mg total) by mouth daily. 379 tablet 2  . folic acid (FOLVITE) 1 MG tablet Take 1 tablet by mouth daily.    Marland Kitchen gabapentin (NEURONTIN) 300 MG  capsule TAKE ONE CAPSULE BY MOUTH AT BEDTIME (Patient taking differently: Take 300 mg by mouth at bedtime. ) 90 capsule 1  . metFORMIN (GLUCOPHAGE) 1000 MG tablet Take 1 tablet (1,000 mg total) by mouth 2 (two) times daily. 180 tablet 2  . methotrexate (RHEUMATREX) 2.5 MG tablet Take 17.5 mg by mouth once a week.     . metoprolol succinate (TOPROL-XL) 100 MG 24 hr tablet Take 1 tablet (100 mg total) by mouth daily. Take with or immediately following a meal. 90 tablet 3  . pravastatin (PRAVACHOL) 20 MG tablet Take 1 tablet (20 mg total) by mouth at bedtime. 90 tablet 2  . predniSONE (DELTASONE) 5 MG tablet Take 5 mg by mouth daily as needed (achiness).     Marland Kitchen omeprazole (PRILOSEC) 20 MG capsule Take 1 capsule (20 mg total) by mouth daily. 90 capsule 1   No facility-administered medications prior to visit.     No Known Allergies  ROS Review of Systems  Constitutional:  Negative for chills, diaphoresis, fatigue, fever and unexpected weight change.  HENT: Positive for congestion, postnasal drip and sore throat.   Eyes: Negative for photophobia and visual disturbance.  Respiratory: Positive for cough and shortness of breath.   Cardiovascular: Negative.   Gastrointestinal: Negative for nausea and vomiting.  Genitourinary: Negative.   Musculoskeletal: Positive for arthralgias and myalgias.  Allergic/Immunologic: Positive for immunocompromised state.  Neurological: Positive for headaches.  Psychiatric/Behavioral: Negative.       Objective:    Physical Exam   Patient was orienting in his room and was not available to speak to me.  Ht 5\' 9"  (1.753 m)   BMI 37.12 kg/m  Wt Readings from Last 3 Encounters:  04/24/18 251 lb 6 oz (114 kg)  10/21/17 258 lb 6 oz (117.2 kg)  07/22/17 265 lb (120.2 kg)     Health Maintenance Due  Topic Date Due  . FOOT EXAM  02/04/1958  . TETANUS/TDAP  02/05/1967  . COLONOSCOPY  02/04/1998  . PNA vac Low Risk Adult (1 of 2 - PCV13) 02/04/2013  . URINE MICROALBUMIN  10/22/2018    There are no preventive care reminders to display for this patient.  Lab Results  Component Value Date   TSH 1.36 07/18/2017   Lab Results  Component Value Date   WBC 5.9 09/26/2018   HGB 13.4 09/26/2018   HCT 41.7 09/26/2018   MCV 87.8 09/26/2018   PLT 249 09/26/2018   Lab Results  Component Value Date   NA 136 09/26/2018   K 3.0 (L) 09/26/2018   CO2 26 09/26/2018   GLUCOSE 125 (H) 09/26/2018   BUN 34 (H) 09/26/2018   CREATININE 1.61 (H) 09/26/2018   BILITOT 0.9 09/26/2018   ALKPHOS 57 09/26/2018   AST 58 (H) 09/26/2018   ALT 47 (H) 09/26/2018   PROT 8.4 (H) 09/26/2018   ALBUMIN 3.7 09/26/2018   CALCIUM 8.9 09/26/2018   ANIONGAP 16 (H) 09/26/2018   GFR 78.47 04/24/2018   Lab Results  Component Value Date   CHOL 100 04/24/2018   Lab Results  Component Value Date   HDL 36.00 (L) 04/24/2018   Lab Results   Component Value Date   LDLCALC 43 04/24/2018   Lab Results  Component Value Date   TRIG 105.0 04/24/2018   Lab Results  Component Value Date   CHOLHDL 3 04/24/2018   Lab Results  Component Value Date   HGBA1C 6.8 (H) 04/24/2018      Assessment & Plan:  Problem List Items Addressed This Visit      Other   Viral syndrome - Primary   Relevant Medications   benzonatate (TESSALON) 100 MG capsule      Meds ordered this encounter  Medications  . DISCONTD: benzonatate (TESSALON) 100 MG capsule    Sig: Take 1 capsule (100 mg total) by mouth 3 (three) times daily as needed for cough.    Dispense:  20 capsule    Refill:  0  . benzonatate (TESSALON) 100 MG capsule    Sig: Take 1 capsule (100 mg total) by mouth 3 (three) times daily as needed for cough.    Dispense:  20 capsule    Refill:  0    Follow-up: Return if symptoms worsen or fail to improve.    Libby Maw, MDVirtual Visit via Video Note  I connected with Candee Furbish on 09/28/18 at  9:30 AM EDT by a video enabled telemedicine application and verified that I am speaking with the correct person using two identifiers.   I discussed the limitations of evaluation and management by telemedicine and the availability of in person appointments. The patient expressed understanding and agreed to proceed.  Interactive video and audio telecommunications were attempted between myself and the patient. However they failed due to the patient having technical difficulties or not having access to video capability. We continued and completed with audio only.  History of Present Illness:    Observations/Objective:   Assessment and Plan:   Follow Up Instructions:    I discussed the assessment and treatment plan with the patient. The patient was provided an opportunity to ask questions and all were answered. The patient agreed with the plan and demonstrated an understanding of the instructions.   The patient was  advised to call back or seek an in-person evaluation if the symptoms worsen or if the condition fails to improve as anticipated.  I provided 15 minutes of non-face-to-face time during this encounter.  Patient's wife will call EMS and have patient transported to the hospital for evaluation.

## 2018-09-26 NOTE — Telephone Encounter (Signed)
Fyi, doxy.me appointment at 9:30.

## 2018-09-26 NOTE — Discharge Instructions (Addendum)
Please read the discharge instructions provided

## 2018-09-26 NOTE — ED Triage Notes (Signed)
Per [facility] pt from home for dry cough, SOB and loss of taste since Saturday. Reports PCP was one who instructed pt to come to ED. 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 Denies any travel.

## 2018-09-26 NOTE — Telephone Encounter (Signed)
Pt.'s wife reports he started feeling bad Saturday - cough,fever, chills, some shortness of breath. Decreased appetite. Called EMS Saturday - temp. Was 102. Instructed wife to give Tylenol or Motrin for his fever. Keep pt. Hydrated. Warm transfer to [facility] in the practice for virtual visit.  Answer Assessment - Initial Assessment Questions 1. COVID-19 DIAGNOSIS: "Who made your Coronavirus (COVID-19) diagnosis?" "Was it confirmed by a positive lab test?" If not diagnosed by a HCP, ask "Are there lots of cases (community spread) where you live?" (See public health department website, if unsure)   * MAJOR community spread: high number of cases; numbers of cases are increasing; many people hospitalized.   * MINOR community spread: low number of cases; not increasing; few or no people hospitalized     No testing done 2. ONSET: "When did the COVID-19 symptoms start?"      This past Saturday 3. WORST SYMPTOM: "What is your worst symptom?" (e.g., cough, fever, shortness of breath, muscle aches)     Fever, cough, chills 4. COUGH: "How bad is the cough?"       Moderate - has always had some shortness of breath 5. FEVER: "Do you have a fever?" If so, ask: "What is your temperature, how was it measured, and when did it start?"     102 yesterday 6. RESPIRATORY STATUS: "Describe your breathing?" (e.g., shortness of breath, wheezing, unable to speak)      Some shortness of breath 7. BETTER-SAME-WORSE: "Are you getting better, staying the same or getting worse compared to yesterday?"  If getting worse, ask, "In what way?"     Same  8. HIGH RISK DISEASE: "Do you have any chronic medical problems?" (e.g., asthma, heart or lung disease, weak immune system, etc.)     Diabetic 9. PREGNANCY: "Is there any chance you are pregnant?" "When was your last menstrual period?"     n/a 10. OTHER SYMPTOMS: "Do you have any other symptoms?"  (e.g., runny nose, headache, sore throat, loss of smell)       No  Protocols used:  CORONAVIRUS (COVID-19) DIAGNOSED OR SUSPECTED-A-AH

## 2018-09-26 NOTE — ED Notes (Signed)
Dr Venora Maples talked to wife and daughter about their concerns with patient's diagnosis and having to come back home to quarantine.  Family is working on getting a patient a ride to come pick patient up from ED.

## 2018-09-26 NOTE — ED Notes (Signed)
Bed: LT07 Expected date:  Expected time:  Means of arrival:  Comments: EMS cough, SHOB, loss of taste

## 2018-09-26 NOTE — ED Provider Notes (Addendum)
[facility] DEPT Provider Note   CSN: [id number] Arrival date & time: 09/26/18  1055    History   Chief Complaint Chief Complaint  Patient presents with   Cough   Shortness of Breath   loss of taste    HPI Mitchell Rose is a 73 y.o. male.     HPI Patient is a 73 year old male who works at a gas station doing stocking who presents the emergency department with fever cough and mild shortness of breath since Saturday.  He feels like he is lost his sense of taste.  No known contact with COVID-19 positive patients or patients under investigation for COVID-19.  No sick contacts in his house.  Reports he feels generally weak.  No unilateral leg swelling.  Denies orthopnea.  No chest pain abdominal pain.  Denies sore throat.  No nausea vomiting or diarrhea.  Brought to the emergency department via EMS.  Oral temp is 100.4.  Recorded temperature of 102 yesterday. Past Medical History:  Diagnosis Date   Diabetes mellitus without complication ([place])    Hypertension     Patient Active Problem List   Diagnosis Date Noted   Viral syndrome 09/26/2018   Normocytic anemia 07/22/2017   Elevated LDL cholesterol level 07/22/2017   Essential hypertension 07/15/2017   ASCVD (arteriosclerotic cardiovascular disease) 07/15/2017   Rheumatoid arthritis involving multiple sites with positive rheumatoid factor ([place]) 07/15/2017   Screen for colon cancer 07/15/2017   Screening for prostate cancer 07/15/2017   Epigastric pain 07/15/2017   Shortness of breath 07/15/2017   Type 2 diabetes mellitus with hyperglycemia, without long-term current use of insulin ([place]) 07/15/2017    Past Surgical History:  Procedure Laterality Date   HERNIA REPAIR          Home Medications    Prior to Admission medications   Medication Sig Start Date End Date Taking? Authorizing Provider  amLODipine (NORVASC) 10 MG tablet Take 1 tablet (10 mg total) by mouth daily.  04/25/18  Yes Libby Maw, MD  aspirin 81 MG chewable tablet Chew 1 tablet (81 mg total) by mouth daily. 04/24/18  Yes Libby Maw, MD  chlorthalidone (HYGROTON) 25 MG tablet Take 1 tablet (25 mg total) by mouth daily. 04/24/18  Yes Libby Maw, MD  folic acid (FOLVITE) 1 MG tablet Take 1 tablet by mouth daily. 12/24/16  Yes [provider]  gabapentin (NEURONTIN) 300 MG capsule TAKE ONE CAPSULE BY MOUTH AT BEDTIME Patient taking differently: Take 300 mg by mouth at bedtime.  06/01/18  Yes Libby Maw, MD  metFORMIN (GLUCOPHAGE) 1000 MG tablet Take 1 tablet (1,000 mg total) by mouth 2 (two) times daily. 04/24/18  Yes Libby Maw, MD  methotrexate (RHEUMATREX) 2.5 MG tablet Take 17.5 mg by mouth once a week.  12/24/16  Yes [provider]  metoprolol succinate (TOPROL-XL) 100 MG 24 hr tablet Take 1 tablet (100 mg total) by mouth daily. Take with or immediately following a meal. 04/24/18  Yes Libby Maw, MD  Multiple Vitamin (MULTIVITAMIN WITH MINERALS) TABS tablet Take 1 tablet by mouth daily.   Yes [provider]  omeprazole (PRILOSEC) 20 MG capsule Take 1 capsule (20 mg total) by mouth daily. 03/31/18  Yes Libby Maw, MD  pravastatin (PRAVACHOL) 20 MG tablet Take 1 tablet (20 mg total) by mouth at bedtime. 04/24/18  Yes Libby Maw, MD  predniSONE (DELTASONE) 5 MG tablet Take 5 mg by mouth daily as needed (  achiness).  06/14/17  Yes [provider]    Family History No family history on file.  Social History Social History   Tobacco Use   Smoking status: Former Smoker   Smokeless tobacco: Never Used  Substance Use Topics   Alcohol use: Not Currently   Drug use: Not on file     Allergies   Patient has no known allergies.   Review of Systems Review of Systems  All other systems reviewed and are negative.    Physical Exam Updated Vital Signs BP 127/80  (BP Location: Right Arm)    Pulse (!) 101    Temp (!) 100.4 F (38 C) (Oral)    Resp 17    Ht 5\' 9"  (1.753 m)    SpO2 96%    BMI 37.12 kg/m   Physical Exam Vitals signs and nursing note reviewed.  Constitutional:      Appearance: He is well-developed.  HENT:     Head: Normocephalic and atraumatic.  Neck:     Musculoskeletal: Normal range of motion.  Cardiovascular:     Rate and Rhythm: Normal rate and regular rhythm.     Heart sounds: Normal heart sounds.  Pulmonary:     Effort: Pulmonary effort is normal. No respiratory distress.     Breath sounds: Normal breath sounds.  Abdominal:     General: There is no distension.     Palpations: Abdomen is soft.     Tenderness: There is no abdominal tenderness.  Musculoskeletal: Normal range of motion.  Skin:    General: Skin is warm and dry.  Neurological:     Mental Status: He is alert and oriented to person, place, and time.  Psychiatric:        Judgment: Judgment normal.      ED Treatments / Results  Labs (all labs ordered are listed, but only abnormal results are displayed) Labs Reviewed  SARS CORONAVIRUS 2 (HOSPITAL ORDER, [facility]) - Abnormal; Notable for the following components:      Result Value   SARS Coronavirus 2 POSITIVE (*)    All other components within normal limits  COMPREHENSIVE METABOLIC PANEL - Abnormal; Notable for the following components:   Potassium 3.0 (*)    Chloride 94 (*)    Glucose, Bld 125 (*)    BUN 34 (*)    Creatinine, Ser 1.61 (*)    Total Protein 8.4 (*)    AST 58 (*)    ALT 47 (*)    GFR calc non Af Amer 43 (*)    GFR calc Af Amer 49 (*)    Anion gap 16 (*)    All other components within normal limits  CULTURE, BLOOD (ROUTINE X 2)  CULTURE, BLOOD (ROUTINE X 2)  LACTIC ACID, PLASMA  CBC WITH DIFFERENTIAL/PLATELET    EKG EKG Interpretation  Date/Time:  Tuesday September 26 2018 11:02:10 EDT Ventricular Rate:  106 PR Interval:    QRS Duration: 88 QT  Interval:  325 QTC Calculation: 432 R Axis:   28 Text Interpretation:  Sinus tachycardia Abnormal R-wave progression, early transition No old tracing to compare Confirmed by Jola Schmidt [id number]) on 09/26/2018 4:28:35 PM   Radiology Dg Chest Port 1 View  Result Date: 09/26/2018 CLINICAL DATA:  Dry cough and short of breath for 4 days EXAM: PORTABLE CHEST 1 VIEW COMPARISON:  None. FINDINGS: Normal heart size. Lungs are under aerated with bibasilar atelectasis. No pneumothorax. No pleural effusion. IMPRESSION: Bibasilar atelectasis  and low lung volumes. Electronically Signed   By: Marybelle Killings M.D.   On: 09/26/2018 11:53    Procedures Procedures (including critical care time)  Medications Ordered in ED Medications  sodium chloride 0.9 % bolus 1,000 mL (0 mLs Intravenous Stopped 09/26/18 1236)     Initial Impression / Assessment and Plan / ED Course  I have reviewed the triage vital signs and the nursing notes.  Pertinent labs & imaging results that were available during my care of the patient were reviewed by me and considered in my medical decision making (see chart for details).        No hypoxia or increased work of breathing.  Patient is positive here for COVID-19.  I had a long discussion with the patient as well as the patient's spouse regarding importance of home quarantine and lack of contact with the public.  I recommended that no family members come to the house.  I recommended a disinfectant protocol for the patient's wife in the house.  At this time I do not think he needs admission to the hospital clinically.  He is overall well-appearing.  He understands to return to the ER for new or worsening symptoms.  Final Clinical Impressions(s) / ED Diagnoses   Final diagnoses:  COVID-19 virus infection    ED Discharge Orders    None       Jola Schmidt, MD 09/26/18 [place], MD 09/26/18 [phone]

## 2018-09-26 NOTE — ED Notes (Signed)
Asked patient if he had heard from his wife regarding how much longer it would be before she got here to pick him up.  Pt states that he did talk to his wife, and she is wanting to speak with the doctor again.  Sent Dr Venora Maples message in [place] letting him know.

## 2018-09-27 ENCOUNTER — Telehealth: Payer: Self-pay

## 2018-09-27 NOTE — Telephone Encounter (Signed)
I spoke with patient's wife. She is concerned because she has copd and is on oxygen. It is hard for her to disinfect the house due to it messing with her copd. She has trouble trying to change the bed sheets as well. I advised her just to have patient take off the dirty sheets and only worry about putting a flat sheet on the bed for now. The fitted sheet can wait until he gets his energy back. At the hospital, patient's wife was told that if she did not come pick the patient up, they would set him outside or call him a cab. Patient's wife had no time to try to change the sheets in the quarantine room or to try and start disinfecting the house. I advised her to worry about getting the sheets changed for now, and I will call her tomorrow to check in on them. She verbalized understanding and thanked me for calling them.

## 2018-09-27 NOTE — Telephone Encounter (Signed)
I read the ER note. Patient was stable and his oxygen levels were adequate. Patient needs to self quarantine, rest and drink plenty of fluids. Follow up with me on Monday for an e visit. If he becomes worse with significant chest pain, shortness of breath or difficulty breathing, take him back to the ER.

## 2018-09-27 NOTE — Telephone Encounter (Signed)
Copied from [place] [id number]. Topic: General - Other >> Sep 27, 2018  8:28 AM Carolyn Stare wrote:  Wife ask that  Dr Ethelene Hal give her call concerning pt, she is asking why they did not keep him and he tested positive COVID 19

## 2018-09-28 ENCOUNTER — Other Ambulatory Visit: Payer: Self-pay | Admitting: Family Medicine

## 2018-09-28 MED ORDER — BENZONATATE 100 MG PO CAPS: 100.0000 mg | ORAL_CAPSULE | Freq: Three times a day (TID) | ORAL | 0 refills | Status: DC | PRN

## 2018-09-28 NOTE — Telephone Encounter (Signed)
Need to do visit with mr. Clotilde Dieter.

## 2018-09-28 NOTE — Telephone Encounter (Signed)
Patient's wife called in stating she is upset about comments that the doctor made while they were at the hospital. States she will call [facility]. Wife also wants to know what direction to go in because she believes he is in need of a medication to help his breathing and cough. States she is able to hear the strain in his lungs through the door. Is requesting a call back at  [phone].

## 2018-09-28 NOTE — Addendum Note (Signed)
Addended by: Jon Billings on: 09/28/2018 11:53 AM   Modules accepted: Orders

## 2018-10-01 LAB — CULTURE, BLOOD (ROUTINE X 2)
Culture: NO GROWTH
Culture: NO GROWTH
Special Requests: ADEQUATE

## 2018-10-12 ENCOUNTER — Ambulatory Visit: Payer: Self-pay

## 2018-10-12 NOTE — Telephone Encounter (Signed)
Patient reports that last month he was [facility] was Tested for Covid-19  . Would like  Results from that.  Test.  Patient reports SOB during the night.  Coughs a lot during the night.

## 2018-10-12 NOTE — Telephone Encounter (Signed)
Tested positive for covid virus.

## 2018-10-13 NOTE — Telephone Encounter (Signed)
I called and spoke with patient. He is aware of the test result. He is feeling better, he slept good last night and not coughing nearly as much. He sounded good on the phone as well. fyi Dr. Ethelene Hal! I told him to let us know if he gets any worse & he has his appointment on the 18th with [place].

## 2018-10-13 NOTE — Telephone Encounter (Signed)
Great.  Thanks

## 2018-10-17 ENCOUNTER — Ambulatory Visit: Payer: Self-pay

## 2018-10-17 NOTE — Telephone Encounter (Signed)
Pt stated he tested positive for Covid-19 09/26/18. Pt stated he has an occasional cough at night, mild SOB. Denies fever and pt stated that he feels good otherwise. Called PCP office and informed FC that pt wants to be retested. Office did not schedule a virtual visit. Called pt back and informed pt that retesting is not offered.  Advised pt to continue with quarantine and wash hands frequently and monitor his symptoms. Advised pt to call back if he worsens.  Reason for Disposition . COVID-19 Testing, questions about  Answer Assessment - Initial Assessment Questions 1. COVID-19 DIAGNOSIS: "Who made your Coronavirus (COVID-19) diagnosis?" "Was it confirmed by a positive lab test?" If not diagnosed by a HCP, ask "Are there lots of cases (community spread) where you live?" (See public health department website, if unsure)   * MAJOR community spread: high number of cases; numbers of cases are increasing; many people hospitalized.   * MINOR community spread: low number of cases; not increasing; few or no people hospitalized     minor 2. ONSET: "When did the COVID-19 symptoms start?"      April 1st 3. WORST SYMPTOM: "What is your worst symptom?" (e.g., cough, fever, shortness of breath, muscle aches)     Occasional cough 4. COUGH: "Do you have a cough?" If so, ask: "How bad is the cough?"       Occasionally at bedtime 5. FEVER: "Do you have a fever?" If so, ask: "What is your temperature, how was it measured, and when did it start?"     no 6. RESPIRATORY STATUS: "Describe your breathing?" (e.g., shortness of breath, wheezing, unable to speak)      no 7. BETTER-SAME-WORSE: "Are you getting better, staying the same or getting worse compared to yesterday?"  If getting worse, ask, "In what way?"   Pt stated that he feels good- occasional SOB walking or activity 8. HIGH RISK DISEASE: "Do you have any chronic medical problems?" (e.g., asthma, heart or lung disease, weak immune system, etc.)  Borderline diabetes 9. PREGNANCY: "Is there any chance you are pregnant?" "When was your last menstrual period?"    n/a 10. OTHER SYMPTOMS: "Do you have any other symptoms?"  (e.g., runny nose, headache, sore throat, loss of smell)      no  Protocols used: CORONAVIRUS (COVID-19) DIAGNOSED OR SUSPECTED-A-AH

## 2018-10-23 ENCOUNTER — Encounter: Payer: Self-pay | Admitting: Family Medicine

## 2018-10-23 ENCOUNTER — Ambulatory Visit (INDEPENDENT_AMBULATORY_CARE_PROVIDER_SITE_OTHER): Payer: Medicare HMO | Admitting: Family Medicine

## 2018-10-23 VITALS — Ht 69.0 in

## 2018-10-23 DIAGNOSIS — Z8616 Personal history of COVID-19: Secondary | ICD-10-CM

## 2018-10-23 DIAGNOSIS — Z8619 Personal history of other infectious and parasitic diseases: Secondary | ICD-10-CM | POA: Diagnosis not present

## 2018-10-23 DIAGNOSIS — R3911 Hesitancy of micturition: Secondary | ICD-10-CM | POA: Diagnosis not present

## 2018-10-23 DIAGNOSIS — R0602 Shortness of breath: Secondary | ICD-10-CM | POA: Diagnosis not present

## 2018-10-23 DIAGNOSIS — N401 Enlarged prostate with lower urinary tract symptoms: Secondary | ICD-10-CM

## 2018-10-23 MED ORDER — TAMSULOSIN HCL 0.4 MG PO CAPS: 0.4000 mg | ORAL_CAPSULE | Freq: Every day | ORAL | 3 refills | Status: DC

## 2018-10-23 NOTE — Progress Notes (Signed)
Virtual Visit via Telephone Note  I connected with Mitchell Rose on 10/23/18 at  2:30 PM EDT by telephone and verified that I am speaking with the correct person using two identifiers.  Location: Patient: home Provider:   I discussed the limitations, risks, security and privacy concerns of performing an evaluation and management service by telephone and the availability of in person appointments. I also discussed with the patient that there may be a patient responsible charge related to this service. The patient expressed understanding and agreed to proceed.  Interactive video and audio telecommunications were attempted between myself and the patient. However they failed due to the patient having technical difficulties or not having access to video capability. We continued and completed with audio only.   History of Present Illness:    Observations/Objective:   Assessment and Plan:   Follow Up Instructions:    Established Patient Office Visit  Subjective:  Patient ID: Mitchell Rose, male    DOB: [birth year]  Age: 73 y.o. MRN: [id number]  CC:  Chief Complaint  Patient presents with  . Follow-up  . Cough    HPI Mitchell Rose presents for follow-up status post Mitchell Rose covid infection fashion approximately 1 month ago.  Patient has recovered entirely.  He feels well.  Denies any lingering fever cough or phlegm production.  Denies wheezing.  He has noticed a decrease in the force of his urine stream.  There is prevoid dribble and some nocturia.  History of BPH on exam.  He is dealing with ongoing shortness of breath.  Denies chest pain nausea vomiting diaphoresis swelling in his lower extremities.  He is sleeping on 2 pillows which is usual for him.  Blood pressure has been well controlled.  Past Medical History:  Diagnosis Date  . Diabetes mellitus without complication ([place])   . Hypertension     Past Surgical History:  Procedure Laterality Date  . HERNIA REPAIR      History  reviewed. No pertinent family history.  Social History   Socioeconomic History  . Marital status: Married    Spouse name: Not on file  . Number of children: Not on file  . Years of education: Not on file  . Highest education level: Not on file  Occupational History  . Not on file  Social Needs  . Financial resource strain: Not on file  . Food insecurity:    Worry: Not on file    Inability: Not on file  . Transportation needs:    Medical: Not on file    Non-medical: Not on file  Tobacco Use  . Smoking status: Former Research scientist (life sciences)  . Smokeless tobacco: Never Used  Substance and Sexual Activity  . Alcohol use: Not Currently  . Drug use: Not on file  . Sexual activity: Not on file  Lifestyle  . Physical activity:    Days per week: Not on file    Minutes per session: Not on file  . Stress: Not on file  Relationships  . Social connections:    Talks on phone: Not on file    Gets together: Not on file    Attends religious service: Not on file    Active member of club or organization: Not on file    Attends meetings of clubs or organizations: Not on file    Relationship status: Not on file  . Intimate partner violence:    Fear of current or ex partner: Not on file    Emotionally abused: Not on file  Physically abused: Not on file    Forced sexual activity: Not on file  Other Topics Concern  . Not on file  Social History Narrative  . Not on file    Outpatient Medications Prior to Visit  Medication Sig Dispense Refill  . amLODipine (NORVASC) 10 MG tablet Take 1 tablet (10 mg total) by mouth daily. 90 tablet 2  . aspirin 81 MG chewable tablet Chew 1 tablet (81 mg total) by mouth daily. 365 tablet 1  . benzonatate (TESSALON) 100 MG capsule Take 1 capsule (100 mg total) by mouth 3 (three) times daily as needed for cough. 20 capsule 0  . chlorthalidone (HYGROTON) 25 MG tablet Take 1 tablet (25 mg total) by mouth daily. 761 tablet 2  . folic acid (FOLVITE) 1 MG tablet Take 1  tablet by mouth daily.    Marland Kitchen gabapentin (NEURONTIN) 300 MG capsule TAKE ONE CAPSULE BY MOUTH AT BEDTIME (Patient taking differently: Take 300 mg by mouth at bedtime. ) 90 capsule 1  . metFORMIN (GLUCOPHAGE) 1000 MG tablet Take 1 tablet (1,000 mg total) by mouth 2 (two) times daily. 180 tablet 2  . methotrexate (RHEUMATREX) 2.5 MG tablet Take 17.5 mg by mouth once a week.     . metoprolol succinate (TOPROL-XL) 100 MG 24 hr tablet Take 1 tablet (100 mg total) by mouth daily. Take with or immediately following a meal. 90 tablet 3  . Multiple Vitamin (MULTIVITAMIN WITH MINERALS) TABS tablet Take 1 tablet by mouth daily.    Marland Kitchen omeprazole (PRILOSEC) 20 MG capsule TAKE ONE CAPSULE BY MOUTH DAILY 90 capsule 0  . pravastatin (PRAVACHOL) 20 MG tablet Take 1 tablet (20 mg total) by mouth at bedtime. 90 tablet 2  . predniSONE (DELTASONE) 5 MG tablet Take 5 mg by mouth daily as needed (achiness).      No facility-administered medications prior to visit.     No Known Allergies  ROS Review of Systems  Constitutional: Negative for chills, diaphoresis, fatigue, fever and unexpected weight change.  Eyes: Negative for photophobia and visual disturbance.  Respiratory: Positive for shortness of breath. Negative for cough, chest tightness and wheezing.   Cardiovascular: Negative for chest pain, palpitations and leg swelling.  Gastrointestinal: Negative for abdominal pain, nausea and vomiting.  Endocrine: Negative for polyphagia and polyuria.  Genitourinary: Positive for difficulty urinating. Negative for decreased urine volume, hematuria and urgency.  Musculoskeletal: Negative for arthralgias and myalgias.  Allergic/Immunologic: Negative for immunocompromised state.  Neurological: Negative for weakness and numbness.  Hematological: Does not bruise/bleed easily.  Psychiatric/Behavioral: Negative.       Objective:    Physical Exam  Constitutional: He is oriented to person, place, and time. No distress.   Pulmonary/Chest: Effort normal.  Neurological: He is alert and oriented to person, place, and time.  Psychiatric: He has a normal mood and affect. His behavior is normal.    Ht 5\' 9"  (1.753 m)   BMI 37.12 kg/m  Wt Readings from Last 3 Encounters:  04/24/18 251 lb 6 oz (114 kg)  10/21/17 258 lb 6 oz (117.2 kg)  07/22/17 265 lb (120.2 kg)     Health Maintenance Due  Topic Date Due  . FOOT EXAM  02/04/1958  . TETANUS/TDAP  02/05/1967  . COLONOSCOPY  02/04/1998  . PNA vac Low Risk Adult (1 of 2 - PCV13) 02/04/2013  . URINE MICROALBUMIN  10/22/2018  . HEMOGLOBIN A1C  10/23/2018    There are no preventive care reminders to display for this patient.  Lab Results  Component Value Date   TSH 1.36 07/18/2017   Lab Results  Component Value Date   WBC 5.9 09/26/2018   HGB 13.4 09/26/2018   HCT 41.7 09/26/2018   MCV 87.8 09/26/2018   PLT 249 09/26/2018   Lab Results  Component Value Date   NA 136 09/26/2018   K 3.0 (L) 09/26/2018   CO2 26 09/26/2018   GLUCOSE 125 (H) 09/26/2018   BUN 34 (H) 09/26/2018   CREATININE 1.61 (H) 09/26/2018   BILITOT 0.9 09/26/2018   ALKPHOS 57 09/26/2018   AST 58 (H) 09/26/2018   ALT 47 (H) 09/26/2018   PROT 8.4 (H) 09/26/2018   ALBUMIN 3.7 09/26/2018   CALCIUM 8.9 09/26/2018   ANIONGAP 16 (H) 09/26/2018   GFR 78.47 04/24/2018   Lab Results  Component Value Date   CHOL 100 04/24/2018   Lab Results  Component Value Date   HDL 36.00 (L) 04/24/2018   Lab Results  Component Value Date   LDLCALC 43 04/24/2018   Lab Results  Component Value Date   TRIG 105.0 04/24/2018   Lab Results  Component Value Date   CHOLHDL 3 04/24/2018   Lab Results  Component Value Date   HGBA1C 6.8 (H) 04/24/2018      Assessment & Plan:   Problem List Items Addressed This Visit      Genitourinary   Benign prostatic hyperplasia with urinary hesitancy   Relevant Medications   tamsulosin (FLOMAX) 0.4 MG CAPS capsule     Other   Shortness of  breath - Primary   History of 2019 novel coronavirus disease (COVID-19)      Meds ordered this encounter  Medications  . tamsulosin (FLOMAX) 0.4 MG CAPS capsule    Sig: Take 1 capsule (0.4 mg total) by mouth daily after supper.    Dispense:  30 capsule    Refill:  3    Follow-up: Return in about 10 days (around 11/02/2018), or if symptoms worsen or fail to improve.    Libby Maw, MD  I discussed the assessment and treatment plan with the patient. The patient was provided an opportunity to ask questions and all were answered. The patient agreed with the plan and demonstrated an understanding of the instructions.   The patient was advised to call back or seek an in-person evaluation if the symptoms worsen or if the condition fails to improve as anticipated.  I provided 25 minutes of non-face-to-face time during this encounter.

## 2018-10-31 ENCOUNTER — Ambulatory Visit (INDEPENDENT_AMBULATORY_CARE_PROVIDER_SITE_OTHER): Payer: Medicare HMO | Admitting: Family Medicine

## 2018-10-31 ENCOUNTER — Encounter: Payer: Self-pay | Admitting: Family Medicine

## 2018-10-31 VITALS — BP 136/80 | HR 101 | Temp 97.7°F | Ht 69.0 in | Wt 208.1 lb

## 2018-10-31 DIAGNOSIS — E1165 Type 2 diabetes mellitus with hyperglycemia: Secondary | ICD-10-CM | POA: Diagnosis not present

## 2018-10-31 DIAGNOSIS — Z8619 Personal history of other infectious and parasitic diseases: Secondary | ICD-10-CM | POA: Diagnosis not present

## 2018-10-31 DIAGNOSIS — E611 Iron deficiency: Secondary | ICD-10-CM

## 2018-10-31 DIAGNOSIS — E7800 Pure hypercholesterolemia, unspecified: Secondary | ICD-10-CM | POA: Diagnosis not present

## 2018-10-31 DIAGNOSIS — Z8616 Personal history of COVID-19: Secondary | ICD-10-CM

## 2018-10-31 DIAGNOSIS — D649 Anemia, unspecified: Secondary | ICD-10-CM | POA: Diagnosis not present

## 2018-10-31 MED ORDER — FERROUS SULFATE 325 (65 FE) MG PO TABS: 325.0000 mg | ORAL_TABLET | Freq: Every day | ORAL | 3 refills | Status: DC

## 2018-10-31 NOTE — Progress Notes (Signed)
Established Patient Office Visit  Subjective:  Patient ID: Mitchell Rose, male    DOB: [birth year]  Age: 73 y.o. MRN: [id number]  CC:  Chief Complaint  Patient presents with  . Follow-up    HPI Mitchell Rose presents for follow-up of his hypertension, diabetes elevated cholesterol and recent COVID infection.  Did not require hospitalization.  Did well. .  Denies any shortness of breath wheezing or tightness in the chest.  Through all this he has been able to lose some significant weight.  Feels much better.  Brings in all of his medicines today.  These were all reviewed.  He never did fill the prescription for his iron.  On side note he had been taking and continues to take Plaquenil for his rheumatoid arthritis.  Blood pressure and blood glucose is been well controlled on his current medical regimen especially with his recent weight loss.  Status post eye check earlier this year.  BPH symptoms are well controlled with the tamsulosin.  He has not been having issues with nocturia.  Past Medical History:  Diagnosis Date  . Diabetes mellitus without complication ([place])   . Hypertension     Past Surgical History:  Procedure Laterality Date  . HERNIA REPAIR      History reviewed. No pertinent family history.  Social History   Socioeconomic History  . Marital status: Married    Spouse name: Not on file  . Number of children: Not on file  . Years of education: Not on file  . Highest education level: Not on file  Occupational History  . Not on file  Social Needs  . Financial resource strain: Not on file  . Food insecurity:    Worry: Not on file    Inability: Not on file  . Transportation needs:    Medical: Not on file    Non-medical: Not on file  Tobacco Use  . Smoking status: Former Research scientist (life sciences)  . Smokeless tobacco: Never Used  Substance and Sexual Activity  . Alcohol use: Not Currently  . Drug use: Not on file  . Sexual activity: Not on file  Lifestyle  . Physical activity:     Days per week: Not on file    Minutes per session: Not on file  . Stress: Not on file  Relationships  . Social connections:    Talks on phone: Not on file    Gets together: Not on file    Attends religious service: Not on file    Active member of club or organization: Not on file    Attends meetings of clubs or organizations: Not on file    Relationship status: Not on file  . Intimate partner violence:    Fear of current or ex partner: Not on file    Emotionally abused: Not on file    Physically abused: Not on file    Forced sexual activity: Not on file  Other Topics Concern  . Not on file  Social History Narrative  . Not on file    Outpatient Medications Prior to Visit  Medication Sig Dispense Refill  . amLODipine (NORVASC) 10 MG tablet Take 1 tablet (10 mg total) by mouth daily. 90 tablet 2  . aspirin 81 MG chewable tablet Chew 1 tablet (81 mg total) by mouth daily. 365 tablet 1  . chlorthalidone (HYGROTON) 25 MG tablet Take 1 tablet (25 mg total) by mouth daily. 983 tablet 2  . folic acid (FOLVITE) 1 MG tablet Take 1 tablet by  mouth daily.    Marland Kitchen gabapentin (NEURONTIN) 300 MG capsule TAKE ONE CAPSULE BY MOUTH AT BEDTIME (Patient taking differently: Take 300 mg by mouth at bedtime. ) 90 capsule 1  . hydroxychloroquine (PLAQUENIL) 200 MG tablet Take 200 mg by mouth daily.    . metFORMIN (GLUCOPHAGE) 1000 MG tablet Take 1 tablet (1,000 mg total) by mouth 2 (two) times daily. 180 tablet 2  . methotrexate (RHEUMATREX) 2.5 MG tablet Take 17.5 mg by mouth once a week.     . metoprolol succinate (TOPROL-XL) 100 MG 24 hr tablet Take 1 tablet (100 mg total) by mouth daily. Take with or immediately following a meal. 90 tablet 3  . Multiple Vitamin (MULTIVITAMIN WITH MINERALS) TABS tablet Take 1 tablet by mouth daily.    Marland Kitchen omeprazole (PRILOSEC) 20 MG capsule TAKE ONE CAPSULE BY MOUTH DAILY 90 capsule 0  . pravastatin (PRAVACHOL) 20 MG tablet Take 1 tablet (20 mg total) by mouth at bedtime.  90 tablet 2  . predniSONE (DELTASONE) 5 MG tablet Take 5 mg by mouth daily as needed (achiness).     . tamsulosin (FLOMAX) 0.4 MG CAPS capsule Take 1 capsule (0.4 mg total) by mouth daily after supper. 30 capsule 3  . benzonatate (TESSALON) 100 MG capsule Take 1 capsule (100 mg total) by mouth 3 (three) times daily as needed for cough. 20 capsule 0   No facility-administered medications prior to visit.     No Known Allergies  ROS Review of Systems  Constitutional: Negative for chills, diaphoresis, fatigue, fever and unexpected weight change.  HENT: Negative.   Eyes: Negative for photophobia and visual disturbance.  Respiratory: Negative for chest tightness, shortness of breath and wheezing.   Cardiovascular: Negative.   Gastrointestinal: Negative.  Negative for anal bleeding and blood in stool.  Endocrine: Negative for polyphagia and polyuria.  Genitourinary: Negative for hematuria.  Musculoskeletal: Negative for gait problem and joint swelling.  Skin: Negative.   Neurological: Negative for headaches.  Hematological: Does not bruise/bleed easily.  Psychiatric/Behavioral: Negative.       Objective:    Physical Exam  Constitutional: He is oriented to person, place, and time. He appears well-developed and well-nourished. No distress.  HENT:  Head: Normocephalic and atraumatic.  Right Ear: External ear normal.  Left Ear: External ear normal.  Mouth/Throat: Oropharynx is clear and moist. No oropharyngeal exudate.  Eyes: Pupils are equal, round, and reactive to light. Conjunctivae are normal. Right eye exhibits no discharge. Left eye exhibits no discharge. No scleral icterus.  Neck: No JVD present. No tracheal deviation present.  Cardiovascular: Normal rate, regular rhythm and normal heart sounds.  Pulmonary/Chest: Effort normal and breath sounds normal. No stridor.  Abdominal: Bowel sounds are normal.  Musculoskeletal:        General: No edema.  Neurological: He is alert and  oriented to person, place, and time.  Skin: Skin is warm and dry. He is not diaphoretic.  Psychiatric: He has a normal mood and affect. His behavior is normal.    BP 136/80   Pulse (!) 101   Temp 97.7 F (36.5 C) (Oral)   Ht 5\' 9"  (1.753 m)   Wt 208 lb 2 oz (94.4 kg)   SpO2 96%   BMI 30.73 kg/m  Wt Readings from Last 3 Encounters:  10/31/18 208 lb 2 oz (94.4 kg)  04/24/18 251 lb 6 oz (114 kg)  10/21/17 258 lb 6 oz (117.2 kg)   BP Readings from Last 3 Encounters:  10/31/18  136/80  09/26/18 108/89  04/24/18 130/80   Guideline developer:  UpToDate (see UpToDate for funding source) Date Released: June 2014  Health Maintenance Due  Topic Date Due  . FOOT EXAM  02/04/1958  . TETANUS/TDAP  02/05/1967  . COLONOSCOPY  02/04/1998  . PNA vac Low Risk Adult (1 of 2 - PCV13) 02/04/2013  . URINE MICROALBUMIN  10/22/2018  . HEMOGLOBIN A1C  10/23/2018    There are no preventive care reminders to display for this patient.  Lab Results  Component Value Date   TSH 1.36 07/18/2017   Lab Results  Component Value Date   WBC 5.9 09/26/2018   HGB 13.4 09/26/2018   HCT 41.7 09/26/2018   MCV 87.8 09/26/2018   PLT 249 09/26/2018   Lab Results  Component Value Date   NA 136 09/26/2018   K 3.0 (L) 09/26/2018   CO2 26 09/26/2018   GLUCOSE 125 (H) 09/26/2018   BUN 34 (H) 09/26/2018   CREATININE 1.61 (H) 09/26/2018   BILITOT 0.9 09/26/2018   ALKPHOS 57 09/26/2018   AST 58 (H) 09/26/2018   ALT 47 (H) 09/26/2018   PROT 8.4 (H) 09/26/2018   ALBUMIN 3.7 09/26/2018   CALCIUM 8.9 09/26/2018   ANIONGAP 16 (H) 09/26/2018   GFR 78.47 04/24/2018   Lab Results  Component Value Date   CHOL 100 04/24/2018   Lab Results  Component Value Date   HDL 36.00 (L) 04/24/2018   Lab Results  Component Value Date   LDLCALC 43 04/24/2018   Lab Results  Component Value Date   TRIG 105.0 04/24/2018   Lab Results  Component Value Date   CHOLHDL 3 04/24/2018   Lab Results  Component  Value Date   HGBA1C 6.8 (H) 04/24/2018      Assessment & Plan:   Problem List Items Addressed This Visit      Endocrine   Type 2 diabetes mellitus with hyperglycemia, without long-term current use of insulin (HCC) - Primary   Relevant Orders   Comprehensive metabolic panel   Hemoglobin A1c   Urinalysis, Routine w reflex microscopic     Other   Normocytic anemia   Relevant Medications   ferrous sulfate 325 (65 FE) MG tablet   Other Relevant Orders   CBC   Iron, TIBC and Ferritin Panel   Elevated LDL cholesterol level   Relevant Orders   LDL cholesterol, direct   History of 2019 novel coronavirus disease (COVID-19)   Relevant Orders   CBC    Other Visit Diagnoses    Iron deficiency       Relevant Medications   ferrous sulfate 325 (65 FE) MG tablet      Meds ordered this encounter  Medications  . ferrous sulfate 325 (65 FE) MG tablet    Sig: Take 1 tablet (325 mg total) by mouth daily with breakfast.    Dispense:  90 tablet    Refill:  3    Follow-up: Return in about 3 months (around 01/31/2019).

## 2018-11-01 LAB — IRON,TIBC AND FERRITIN PANEL
%SAT: 28 % (calc) (ref 20–48)
Ferritin: 172 ng/mL (ref 24–380)
Iron: 73 ug/dL (ref 50–180)
TIBC: 265 mcg/dL (calc) (ref 250–425)

## 2018-11-01 LAB — COMPREHENSIVE METABOLIC PANEL
AG Ratio: 1.1 (calc) (ref 1.0–2.5)
ALT: 10 U/L (ref 9–46)
AST: 13 U/L (ref 10–35)
Albumin: 3.7 g/dL (ref 3.6–5.1)
Alkaline phosphatase (APISO): 62 U/L (ref 35–144)
BUN: 18 mg/dL (ref 7–25)
CO2: 27 mmol/L (ref 20–32)
Calcium: 9.7 mg/dL (ref 8.6–10.3)
Chloride: 101 mmol/L (ref 98–110)
Creat: 1.17 mg/dL (ref 0.70–1.18)
Globulin: 3.5 g/dL (calc) (ref 1.9–3.7)
Glucose, Bld: 97 mg/dL (ref 65–99)
Potassium: 3.8 mmol/L (ref 3.5–5.3)
Sodium: 138 mmol/L (ref 135–146)
Total Bilirubin: 0.5 mg/dL (ref 0.2–1.2)
Total Protein: 7.2 g/dL (ref 6.1–8.1)

## 2018-11-01 LAB — URINALYSIS, ROUTINE W REFLEX MICROSCOPIC
Bacteria, UA: NONE SEEN /HPF
Bilirubin Urine: NEGATIVE
Glucose, UA: NEGATIVE
Hgb urine dipstick: NEGATIVE
Hyaline Cast: NONE SEEN /LPF
Nitrite: NEGATIVE
Specific Gravity, Urine: 1.025 (ref 1.001–1.03)
Squamous Epithelial / HPF: NONE SEEN /HPF (ref <=5)
WBC, UA: NONE SEEN /HPF (ref 0–5)
pH: 7 (ref 5.0–8.0)

## 2018-11-01 LAB — LDL CHOLESTEROL, DIRECT: Direct LDL: 45 mg/dL (ref <100)

## 2018-11-01 LAB — CBC
HCT: 32.6 % — ABNORMAL LOW (ref 38.5–50.0)
Hemoglobin: 10.8 g/dL — ABNORMAL LOW (ref 13.2–17.1)
MCH: 28.7 pg (ref 27.0–33.0)
MCHC: 33.1 g/dL (ref 32.0–36.0)
MCV: 86.7 fL (ref 80.0–100.0)
MPV: 10.9 fL (ref 7.5–12.5)
Platelets: 406 10*3/uL — ABNORMAL HIGH (ref 140–400)
RBC: 3.76 10*6/uL — ABNORMAL LOW (ref 4.20–5.80)
RDW: 16.2 % — ABNORMAL HIGH (ref 11.0–15.0)
WBC: 6.3 10*3/uL (ref 3.8–10.8)

## 2018-11-01 LAB — HEMOGLOBIN A1C
Hgb A1c MFr Bld: 6.1 % of total Hgb — ABNORMAL HIGH (ref <5.7)
Mean Plasma Glucose: 128 (calc)
eAG (mmol/L): 7.1 (calc)

## 2018-11-02 ENCOUNTER — Telehealth: Payer: Self-pay | Admitting: *Deleted

## 2018-11-02 ENCOUNTER — Ambulatory Visit: Payer: Medicare HMO | Admitting: Family Medicine

## 2018-11-02 NOTE — Telephone Encounter (Signed)
I called.   I asked if he would be willing to donate plasma.   I explained how we would use his antibodies from where he had the COVID-19 to assist in the recover of other people recovering from the virus.  He responded,  "Let me think about".   "Who do I let know if I want to donate"?   I gave him the [email or web address] web site with instructions on what to do.   After he fills out the application on line someone would contact him to set up the donation.  I thanked him for considering donating.

## 2018-11-10 DIAGNOSIS — M0579 Rheumatoid arthritis with rheumatoid factor of multiple sites without organ or systems involvement: Secondary | ICD-10-CM | POA: Diagnosis not present

## 2018-11-10 DIAGNOSIS — Z79899 Other long term (current) drug therapy: Secondary | ICD-10-CM | POA: Diagnosis not present

## 2018-11-23 ENCOUNTER — Other Ambulatory Visit: Payer: Self-pay | Admitting: Family Medicine

## 2018-12-02 ENCOUNTER — Other Ambulatory Visit: Payer: Self-pay | Admitting: Family Medicine

## 2018-12-16 DIAGNOSIS — R69 Illness, unspecified: Secondary | ICD-10-CM | POA: Diagnosis not present

## 2018-12-20 ENCOUNTER — Other Ambulatory Visit: Payer: Self-pay | Admitting: Family Medicine

## 2019-01-15 DIAGNOSIS — I10 Essential (primary) hypertension: Secondary | ICD-10-CM | POA: Diagnosis not present

## 2019-01-15 DIAGNOSIS — Z87891 Personal history of nicotine dependence: Secondary | ICD-10-CM | POA: Diagnosis not present

## 2019-01-15 DIAGNOSIS — M0579 Rheumatoid arthritis with rheumatoid factor of multiple sites without organ or systems involvement: Secondary | ICD-10-CM | POA: Diagnosis not present

## 2019-01-15 DIAGNOSIS — I2510 Atherosclerotic heart disease of native coronary artery without angina pectoris: Secondary | ICD-10-CM | POA: Diagnosis not present

## 2019-01-18 ENCOUNTER — Other Ambulatory Visit: Payer: Self-pay | Admitting: Family Medicine

## 2019-01-18 DIAGNOSIS — N401 Enlarged prostate with lower urinary tract symptoms: Secondary | ICD-10-CM

## 2019-01-22 ENCOUNTER — Telehealth: Payer: Self-pay | Admitting: Family Medicine

## 2019-01-22 NOTE — Telephone Encounter (Signed)
Caller name: Eoghan, Belcher Relation to pt: spouse  Call back number: [phone]  Reason for call:  Patient lost wallet on Saturday, patient in need of last office visit reflecting name, d.o.b and practice stamp to prove to social security.Patient would like to pick up office note tomorrow, please advise when ready for pick up.

## 2019-01-22 NOTE — Telephone Encounter (Signed)
I called and spoke with pt's wife. I let her know that the form is up front & ready for pick up.

## 2019-02-01 ENCOUNTER — Other Ambulatory Visit: Payer: Self-pay

## 2019-02-01 ENCOUNTER — Ambulatory Visit (INDEPENDENT_AMBULATORY_CARE_PROVIDER_SITE_OTHER): Payer: Medicare HMO | Admitting: Family Medicine

## 2019-02-01 ENCOUNTER — Encounter: Payer: Self-pay | Admitting: Family Medicine

## 2019-02-01 VITALS — BP 120/76 | HR 102 | Ht 69.0 in | Wt 216.1 lb

## 2019-02-01 DIAGNOSIS — M545 Low back pain, unspecified: Secondary | ICD-10-CM

## 2019-02-01 DIAGNOSIS — Z8619 Personal history of other infectious and parasitic diseases: Secondary | ICD-10-CM | POA: Diagnosis not present

## 2019-02-01 DIAGNOSIS — E876 Hypokalemia: Secondary | ICD-10-CM | POA: Diagnosis not present

## 2019-02-01 DIAGNOSIS — E1165 Type 2 diabetes mellitus with hyperglycemia: Secondary | ICD-10-CM | POA: Diagnosis not present

## 2019-02-01 DIAGNOSIS — D649 Anemia, unspecified: Secondary | ICD-10-CM | POA: Diagnosis not present

## 2019-02-01 DIAGNOSIS — E611 Iron deficiency: Secondary | ICD-10-CM | POA: Diagnosis not present

## 2019-02-01 DIAGNOSIS — Z8616 Personal history of COVID-19: Secondary | ICD-10-CM

## 2019-02-01 LAB — CBC
HCT: 33.8 % — ABNORMAL LOW (ref 39.0–52.0)
Hemoglobin: 11.2 g/dL — ABNORMAL LOW (ref 13.0–17.0)
MCHC: 33.3 g/dL (ref 30.0–36.0)
MCV: 90.7 fl (ref 78.0–100.0)
Platelets: 388 10*3/uL (ref 150.0–400.0)
RBC: 3.73 Mil/uL — ABNORMAL LOW (ref 4.22–5.81)
RDW: 14.9 % (ref 11.5–15.5)
WBC: 5.8 10*3/uL (ref 4.0–10.5)

## 2019-02-01 LAB — URINALYSIS, ROUTINE W REFLEX MICROSCOPIC
Bilirubin Urine: NEGATIVE
Hgb urine dipstick: NEGATIVE
Ketones, ur: NEGATIVE
Leukocytes,Ua: NEGATIVE
Nitrite: NEGATIVE
RBC / HPF: NONE SEEN (ref 0–?)
Specific Gravity, Urine: 1.025 (ref 1.000–1.030)
Total Protein, Urine: NEGATIVE
Urine Glucose: NEGATIVE
Urobilinogen, UA: 2 — AB (ref 0.0–1.0)
pH: 7 (ref 5.0–8.0)

## 2019-02-01 LAB — BASIC METABOLIC PANEL
BUN: 18 mg/dL (ref 6–23)
CO2: 29 mEq/L (ref 19–32)
Calcium: 9.8 mg/dL (ref 8.4–10.5)
Chloride: 102 mEq/L (ref 96–112)
Creatinine, Ser: 0.94 mg/dL (ref 0.40–1.50)
GFR: 79.11 mL/min (ref >60.00)
Glucose, Bld: 86 mg/dL (ref 70–99)
Potassium: 3.2 mEq/L — ABNORMAL LOW (ref 3.5–5.1)
Sodium: 142 mEq/L (ref 135–145)

## 2019-02-01 LAB — HEMOGLOBIN A1C: Hgb A1c MFr Bld: 6 % (ref 4.6–6.5)

## 2019-02-01 NOTE — Patient Instructions (Signed)
Back Injury Prevention Back injuries can be very painful. They can also be difficult to heal. After having one back injury, you are more likely to have another one again. It is important to learn how to avoid injuring or re-injuring your back. The following tips can help you to prevent a back injury. What actions can I take to prevent back injuries? Changes in your diet Talk with your doctor about what to eat. Some foods can make the bones strong.  Talk with your doctor about how much calcium and vitamin D you need each day. These nutrients help to prevent weakening of the bones (osteoporosis).  Eat foods that have calcium. These include: ? Dairy products. ? Green leafy vegetables. ? Food and drinks that have had calcium added to them (fortified).  Eat foods that have vitamin D. These include: ? Milk. ? Food and drinks that have had vitamin D added to them.  Take other supplements and vitamins only as told by your doctor. Physical fitness Physical fitness makes your bones and muscles strong. It also improves your balance and strength.  Exercise for 30 minutes per day on most days of the week, or as told by your doctor. Make sure to: ? Do aerobic exercises, such as walking, jogging, biking, or swimming. ? Do exercises that increase balance and strength, such as tai chi and yoga. ? Do stretching exercises. This helps with flexibility. ? Develop strong belly (abdominal) muscles. Your belly muscles help to support your back.  Stay at a healthy weight. This lowers your risk of a back injury. Good posture        Prevent back injuries by developing and maintaining a good posture. To do this:  Sit up straight and stand up straight. Avoid leaning forward when you sit or hunching over when you stand.  Choose chairs that have good low-back (lumbar) support.  If you work at a desk: ? Sit close to it so you do not need to lean over. ? Keep your chin tucked in. ? Keep your neck drawn  back. ? Keep your elbows bent so that your arms make a corner (right angle).  When you drive: ? Sit high and close to the steering wheel. Add a low-back support to your car seat, if needed. ? Take breaks every hour if you are driving for long periods of time.  Avoid sitting or standing in one position for very long. Take breaks to get up, stretch, and walk around at least once every hour.  Sleep on your side with your knees slightly bent, or sleep on your back with a pillow under your knees.  Lifting, twisting, and reaching   Heavy lifting ? Avoid heavy lifting, especially lifting over and over again. If you must do heavy lifting: ? Stretch before lifting. ? Work slowly. ? Rest between lifts. ? Use a tool such as a cart or a dolly to move objects if one is available. ? Make several small trips instead of carrying one heavy load. ? Ask for help when you need it, especially when moving big objects. ? Follow these steps when lifting: ? Stand with your feet shoulder-width apart. ? Get as close to the object as you can. Do not pick up a heavy object that is far from your body. ? Use handles or lifting straps if they are available. ? Bend at your knees. Squat down, but keep your heels off the floor. ? Keep your shoulders back. Keep your chin tucked in. Keep  your back straight. ? Lift the object slowly while you tighten the muscles in your legs, belly, and bottom. Keep the object as close to the center of your body as possible. ? Follow these steps when putting down a heavy load: ? Stand with your feet shoulder-width apart. ? Lower the object slowly while you tighten the muscles in your legs, belly, and bottom. Keep the object as close to the center of your body as possible. ? Keep your shoulders back. Keep your chin tucked in. Keep your back straight. ? Bend at your knees. Squat down, but keep your heels off the floor. ? Use handles or lifting straps if they are available.  Twisting and  reaching ? Avoid lifting heavy objects above your waist. ? Do not twist at your waist while you are lifting or carrying a load. If you need to turn, move your feet. ? Do not bend over without bending at your knees. ? Avoid reaching over your head, across a table, or for an object on a high surface. Other things to do   Avoid wet floors and icy ground. Keep sidewalks clear of ice to prevent falls.  Do not sleep on a mattress that is too soft or too hard.  Store heavier objects on shelves at waist level.  Store lighter objects on lower or higher shelves.  Find ways to lower your stress, such as: ? Exercise. ? Massage. ? Relaxation techniques.  Talk with your doctor if you feel anxious or depressed. These conditions can make back pain worse.  Wear flat heel shoes with cushioned soles.  Use both shoulder straps when carrying a backpack.  Do not use any products that contain nicotine or tobacco, such as cigarettes and e-cigarettes. If you need help quitting, ask your doctor. Summary  Back injuries can be very painful and difficult to heal.  You can keep your back healthy by making certain changes. These include eating foods that make bones strong, working on being physically fit, developing a good posture, and lifting heavy objects in a safe way. This information is not intended to replace advice given to you by your health care provider. Make sure you discuss any questions you have with your health care provider. Document Released: 11/10/2007 Document Revised: 07/15/2017 Document Reviewed: 07/15/2017 Elsevier Patient Education  2020 [employer].

## 2019-02-01 NOTE — Progress Notes (Addendum)
Established Patient Office Visit  Subjective:  Patient ID: Mitchell Rose, male    DOB: Jun 16, 1945  Age: 72 y.o. MRN: [id number]  CC:  Chief Complaint  Patient presents with  . Follow-up    HPI Mitchell Rose presents for follow-up of his diabetes and anemia.  He was able to see the eye doctor this year who performed a dilated exam.  Following his intraocular pressures and he will be due back in January.  Has few teeth left and does not see the dentist regularly.  He remains active in his job at [employer].  He does have some lower back pain predominantly on the right that is non-radiating when he gets home from work sometimes.  Pain is relieved with Tylenol.  There is no radiation into the leg numbness or tingling or change in his bowel or bladder habits.  Continues to see Dr. Herby Abraham.  For his blood pressure and cholesterol control.  Has done well since his COVID infection.  Past Medical History:  Diagnosis Date  . Diabetes mellitus without complication ([place])   . Hypertension     Past Surgical History:  Procedure Laterality Date  . HERNIA REPAIR      History reviewed. No pertinent family history.  Social History   Socioeconomic History  . Marital status: Married    Spouse name: Not on file  . Number of children: Not on file  . Years of education: Not on file  . Highest education level: Not on file  Occupational History  . Not on file  Social Needs  . Financial resource strain: Not on file  . Food insecurity    Worry: Not on file    Inability: Not on file  . Transportation needs    Medical: Not on file    Non-medical: Not on file  Tobacco Use  . Smoking status: Former Research scientist (life sciences)  . Smokeless tobacco: Never Used  Substance and Sexual Activity  . Alcohol use: Not Currently  . Drug use: Not on file  . Sexual activity: Not on file  Lifestyle  . Physical activity    Days per week: Not on file    Minutes per session: Not on file  . Stress: Not on file   Relationships  . Social Herbalist on phone: Not on file    Gets together: Not on file    Attends religious service: Not on file    Active member of club or organization: Not on file    Attends meetings of clubs or organizations: Not on file    Relationship status: Not on file  . Intimate partner violence    Fear of current or ex partner: Not on file    Emotionally abused: Not on file    Physically abused: Not on file    Forced sexual activity: Not on file  Other Topics Concern  . Not on file  Social History Narrative  . Not on file    Outpatient Medications Prior to Visit  Medication Sig Dispense Refill  . amLODipine (NORVASC) 10 MG tablet Take 1 tablet (10 mg total) by mouth daily. 90 tablet 2  . aspirin 81 MG chewable tablet Chew 1 tablet (81 mg total) by mouth daily. 365 tablet 1  . chlorthalidone (HYGROTON) 25 MG tablet Take 1 tablet (25 mg total) by mouth daily. 100 tablet 2  . ferrous sulfate 325 (65 FE) MG tablet Take 1 tablet (325 mg total) by mouth daily with breakfast. 90  tablet 3  . folic acid (FOLVITE) 1 MG tablet Take 1 tablet by mouth daily.    Marland Kitchen gabapentin (NEURONTIN) 300 MG capsule TAKE ONE CAPSULE BY MOUTH AT BEDTIME 90 capsule 0  . hydroxychloroquine (PLAQUENIL) 200 MG tablet Take 200 mg by mouth daily.    . metFORMIN (GLUCOPHAGE) 1000 MG tablet Take 1 tablet (1,000 mg total) by mouth 2 (two) times daily. 180 tablet 2  . methotrexate (RHEUMATREX) 2.5 MG tablet Take 17.5 mg by mouth once a week.     . metoprolol succinate (TOPROL-XL) 100 MG 24 hr tablet Take 1 tablet (100 mg total) by mouth daily. Take with or immediately following a meal. 90 tablet 3  . Multiple Vitamin (MULTIVITAMIN WITH MINERALS) TABS tablet Take 1 tablet by mouth daily.    Marland Kitchen omeprazole (PRILOSEC) 20 MG capsule TAKE ONE CAPSULE BY MOUTH DAILY 90 capsule 2  . pravastatin (PRAVACHOL) 20 MG tablet Take 1 tablet (20 mg total) by mouth at bedtime. 90 tablet 2  . predniSONE (DELTASONE) 5 MG  tablet Take 5 mg by mouth daily as needed (achiness).     . tamsulosin (FLOMAX) 0.4 MG CAPS capsule TAKE ONE CAPSULE BY MOUTH DAILY AFTER SUPPER 30 capsule 2   No facility-administered medications prior to visit.     No Known Allergies  ROS Review of Systems  Constitutional: Negative for diaphoresis, fatigue, fever and unexpected weight change.  HENT: Negative.   Eyes: Negative for photophobia and visual disturbance.  Respiratory: Negative.   Cardiovascular: Negative.   Endocrine: Negative for polyphagia and polyuria.  Genitourinary: Negative.   Musculoskeletal: Positive for back pain. Negative for gait problem.  Skin: Negative for pallor and rash.  Allergic/Immunologic: Negative for immunocompromised state.  Neurological: Negative for weakness and numbness.  Hematological: Negative.   Psychiatric/Behavioral: Negative.       Objective:    Physical Exam  Constitutional: He is oriented to person, place, and time. He appears well-developed and well-nourished. No distress.  HENT:  Head: Normocephalic and atraumatic.  Right Ear: External ear normal.  Left Ear: External ear normal.  Mouth/Throat: Oropharynx is clear and moist. No oropharyngeal exudate.  Eyes: Pupils are equal, round, and reactive to light. Conjunctivae are normal. Right eye exhibits no discharge. Left eye exhibits no discharge. No scleral icterus.  Neck: No JVD present. No tracheal deviation present.  Cardiovascular: Normal rate, regular rhythm and normal heart sounds.  Pulmonary/Chest: Effort normal and breath sounds normal. No stridor.  Abdominal: Bowel sounds are normal.  Musculoskeletal:     Lumbar back: He exhibits normal range of motion, no tenderness, no bony tenderness and no spasm.  Neurological: He is alert and oriented to person, place, and time. He has normal strength.  Reflex Scores:      Patellar reflexes are 1+ on the right side and 1+ on the left side.      Achilles reflexes are 1+ on the right  side and 1+ on the left side. Negative straight leg raises.   Skin: Skin is warm and dry. He is not diaphoretic.  Psychiatric: He has a normal mood and affect. His behavior is normal.    BP 120/76   Pulse (!) 102   Ht 5\' 9"  (1.753 m)   Wt 216 lb 2 oz (98 kg)   SpO2 98%   BMI 31.92 kg/m  Wt Readings from Last 3 Encounters:  02/01/19 216 lb 2 oz (98 kg)  10/31/18 208 lb 2 oz (94.4 kg)  04/24/18 251 lb  6 oz (114 kg)   BP Readings from Last 3 Encounters:  02/01/19 120/76  10/31/18 136/80  09/26/18 108/89   Guideline developer:  UpToDate (see UpToDate for funding source) Date Released: June 2014  Health Maintenance Due  Topic Date Due  . FOOT EXAM  02/04/1958  . TETANUS/TDAP  02/05/1967  . COLONOSCOPY  02/04/1998  . URINE MICROALBUMIN  10/22/2018  . INFLUENZA VACCINE  01/06/2019    There are no preventive care reminders to display for this patient.  Lab Results  Component Value Date   TSH 1.36 07/18/2017   Lab Results  Component Value Date   WBC 5.8 02/01/2019   HGB 11.2 (L) 02/01/2019   HCT 33.8 (L) 02/01/2019   MCV 90.7 02/01/2019   PLT 388.0 02/01/2019   Lab Results  Component Value Date   NA 142 02/01/2019   K 3.2 (L) 02/01/2019   CO2 29 02/01/2019   GLUCOSE 86 02/01/2019   BUN 18 02/01/2019   CREATININE 0.94 02/01/2019   BILITOT 0.5 10/31/2018   ALKPHOS 57 09/26/2018   AST 13 10/31/2018   ALT 10 10/31/2018   PROT 7.2 10/31/2018   ALBUMIN 3.7 09/26/2018   CALCIUM 9.8 02/01/2019   ANIONGAP 16 (H) 09/26/2018   GFR 79.11 02/01/2019   Lab Results  Component Value Date   CHOL 100 04/24/2018   Lab Results  Component Value Date   HDL 36.00 (L) 04/24/2018   Lab Results  Component Value Date   LDLCALC 43 04/24/2018   Lab Results  Component Value Date   TRIG 105.0 04/24/2018   Lab Results  Component Value Date   CHOLHDL 3 04/24/2018   Lab Results  Component Value Date   HGBA1C 6.0 02/01/2019      Assessment & Plan:   Problem List  Items Addressed This Visit      Endocrine   Type 2 diabetes mellitus with hyperglycemia, without long-term current use of insulin (HCC)   Relevant Orders   Basic metabolic panel (Completed)   Urinalysis, Routine w reflex microscopic (Completed)   Hemoglobin A1c (Completed)     Other   Normocytic anemia   Relevant Orders   CBC (Completed)   History of 2019 novel coronavirus disease (COVID-19) - Primary   Relevant Orders   SAR CoV2 Serology (COVID 19)AB(IGG)IA (Completed)   Right-sided low back pain without sciatica   Iron deficiency   Relevant Orders   Iron, TIBC and Ferritin Panel (Completed)    Other Visit Diagnoses    Hypokalemia       Relevant Medications   potassium chloride SA (K-DUR) 20 MEQ tablet      Meds ordered this encounter  Medications  . potassium chloride SA (K-DUR) 20 MEQ tablet    Sig: Take 1 tablet (20 mEq total) by mouth daily.    Dispense:  30 tablet    Refill:  5    Follow-up: Return in about 6 months (around 08/04/2019).   Patient will continue current medications.  Believe that his current job helps to keep him engaged active.  Checking a COVID IgG antibodies.  He has had his flu vaccine and pneumonia vaccine this year.

## 2019-02-02 LAB — IRON,TIBC AND FERRITIN PANEL
%SAT: 10 % (calc) — ABNORMAL LOW (ref 20–48)
Ferritin: 68 ng/mL (ref 24–380)
Iron: 28 ug/dL — ABNORMAL LOW (ref 50–180)
TIBC: 277 mcg/dL (calc) (ref 250–425)

## 2019-02-02 LAB — SAR COV2 SEROLOGY (COVID19)AB(IGG),IA: SARS CoV2 AB IGG: POSITIVE — AB

## 2019-02-02 MED ORDER — POTASSIUM CHLORIDE CRYS ER 20 MEQ PO TBCR: 20.0000 meq | EXTENDED_RELEASE_TABLET | Freq: Every day | ORAL | 5 refills | Status: DC

## 2019-02-02 NOTE — Addendum Note (Signed)
Addended by: Jon Billings on: 02/02/2019 03:40 PM   Modules accepted: Orders

## 2019-02-05 ENCOUNTER — Telehealth: Payer: Self-pay | Admitting: Family Medicine

## 2019-02-05 NOTE — Telephone Encounter (Signed)
I spoke with pt. I advised him that his labs did not go to [facility] and that there was not a test ordered for him that would require a urine kit. Pt verbalized understanding and will throw out the kit. 

## 2019-02-05 NOTE — Telephone Encounter (Signed)
Pt request to speak Dr. Bebe Shaggy assistant, pt stated he received a urine kit from [facility] in the mail. He is not sure why he got this when labs result didn't mentions anything about it (check in chart do not see any order for this). Please help call the pt back today if possible.

## 2019-02-21 DIAGNOSIS — M0579 Rheumatoid arthritis with rheumatoid factor of multiple sites without organ or systems involvement: Secondary | ICD-10-CM | POA: Diagnosis not present

## 2019-02-21 DIAGNOSIS — Z79899 Other long term (current) drug therapy: Secondary | ICD-10-CM | POA: Diagnosis not present

## 2019-02-26 DIAGNOSIS — Z79899 Other long term (current) drug therapy: Secondary | ICD-10-CM | POA: Diagnosis not present

## 2019-02-26 DIAGNOSIS — M0579 Rheumatoid arthritis with rheumatoid factor of multiple sites without organ or systems involvement: Secondary | ICD-10-CM | POA: Diagnosis not present

## 2019-03-09 DIAGNOSIS — R69 Illness, unspecified: Secondary | ICD-10-CM | POA: Diagnosis not present

## 2019-03-22 ENCOUNTER — Other Ambulatory Visit: Payer: Self-pay

## 2019-03-22 ENCOUNTER — Other Ambulatory Visit: Payer: Self-pay | Admitting: Family Medicine

## 2019-03-22 DIAGNOSIS — I2510 Atherosclerotic heart disease of native coronary artery without angina pectoris: Secondary | ICD-10-CM

## 2019-03-22 DIAGNOSIS — E7800 Pure hypercholesterolemia, unspecified: Secondary | ICD-10-CM

## 2019-03-22 MED ORDER — PRAVASTATIN SODIUM 20 MG PO TABS: 20.0000 mg | ORAL_TABLET | Freq: Every day | ORAL | 2 refills | Status: DC

## 2019-04-03 ENCOUNTER — Other Ambulatory Visit: Payer: Self-pay

## 2019-04-03 DIAGNOSIS — N401 Enlarged prostate with lower urinary tract symptoms: Secondary | ICD-10-CM

## 2019-04-03 MED ORDER — TAMSULOSIN HCL 0.4 MG PO CAPS: ORAL_CAPSULE | ORAL | 0 refills | Status: DC

## 2019-04-19 ENCOUNTER — Other Ambulatory Visit: Payer: Self-pay | Admitting: Family Medicine

## 2019-04-19 DIAGNOSIS — E1165 Type 2 diabetes mellitus with hyperglycemia: Secondary | ICD-10-CM

## 2019-05-25 DIAGNOSIS — M0579 Rheumatoid arthritis with rheumatoid factor of multiple sites without organ or systems involvement: Secondary | ICD-10-CM | POA: Diagnosis not present

## 2019-05-25 DIAGNOSIS — Z79899 Other long term (current) drug therapy: Secondary | ICD-10-CM | POA: Diagnosis not present

## 2019-06-04 ENCOUNTER — Other Ambulatory Visit: Payer: Self-pay | Admitting: Family Medicine

## 2019-06-05 NOTE — Progress Notes (Signed)
Virtual Visit via Video Note  I connected with patient on 06/06/19 at  1:00 PM EST by audio enabled telemedicine application and verified that I am speaking with the correct person using two identifiers.   THIS ENCOUNTER IS A VIRTUAL VISIT DUE TO COVID-19 - PATIENT WAS NOT SEEN IN THE OFFICE. PATIENT HAS CONSENTED TO VIRTUAL VISIT / TELEMEDICINE VISIT   Location of patient: home  Location of provider: office  I discussed the limitations of evaluation and management by telemedicine and the availability of in person appointments. The patient expressed understanding and agreed to proceed.   Subjective:   Mitchell Rose is a 73 y.o. male who presents for an Initial Medicare Annual Wellness Visit.  Pt still works part time at [employer].  Review of Systems Home Safety/Smoke Alarms: Feels safe in home. Smoke alarms in place.  Lives w/ wife in 1 story home.  Male:   CCS- pt reports done this year in [place].  PSA-  Lab Results  Component Value Date   PSA 2.73 07/18/2017   PSA 2.6 12/24/2015      Objective:     Advanced Directives 06/06/2019 09/26/2018  Does Patient Have a Medical Advance Directive? No -  Type of Advance Directive - Living will;Healthcare Power of [place] in Chart? - No - copy requested  Would patient like information on creating a medical advance directive? No - Patient declined -    Current Medications (verified) Outpatient Encounter Medications as of 06/06/2019  Medication Sig  . amLODipine (NORVASC) 10 MG tablet Take 1 tablet (10 mg total) by mouth daily.  Marland Kitchen aspirin 81 MG chewable tablet Chew 1 tablet (81 mg total) by mouth daily.  . chlorthalidone (HYGROTON) 25 MG tablet Take 1 tablet (25 mg total) by mouth daily.  . ferrous sulfate 325 (65 FE) MG tablet Take 1 tablet (325 mg total) by mouth daily with breakfast.  . folic acid (FOLVITE) 1 MG tablet Take 1 tablet by mouth daily.  Marland Kitchen gabapentin (NEURONTIN)  300 MG capsule TAKE ONE CAPSULE BY MOUTH AT BEDTIME  . hydroxychloroquine (PLAQUENIL) 200 MG tablet Take 200 mg by mouth daily.  . metFORMIN (GLUCOPHAGE) 1000 MG tablet TAKE ONE TABLET BY MOUTH TWICE A DAY  . methotrexate (RHEUMATREX) 2.5 MG tablet Take 17.5 mg by mouth once a week.   . metoprolol succinate (TOPROL-XL) 100 MG 24 hr tablet Take 1 tablet (100 mg total) by mouth daily. Take with or immediately following a meal.  . Multiple Vitamin (MULTIVITAMIN WITH MINERALS) TABS tablet Take 1 tablet by mouth daily.  Marland Kitchen omeprazole (PRILOSEC) 20 MG capsule TAKE ONE CAPSULE BY MOUTH DAILY  . potassium chloride SA (K-DUR) 20 MEQ tablet Take 1 tablet (20 mEq total) by mouth daily.  . pravastatin (PRAVACHOL) 20 MG tablet TAKE ONE TABLET BY MOUTH EVERY NIGHT AT BEDTIME  . predniSONE (DELTASONE) 5 MG tablet Take 5 mg by mouth daily as needed (achiness).   . tamsulosin (FLOMAX) 0.4 MG CAPS capsule TAKE ONE CAPSULE BY MOUTH DAILY AFTER SUPPER   No facility-administered encounter medications on file as of 06/06/2019.    Allergies (verified) Patient has no known allergies.   History: Past Medical History:  Diagnosis Date  . Diabetes mellitus without complication ([place])   . Hypertension    Past Surgical History:  Procedure Laterality Date  . HERNIA REPAIR     History reviewed. No pertinent family history. Social History   Socioeconomic History  .  Marital status: Married    Spouse name: Not on file  . Number of children: Not on file  . Years of education: Not on file  . Highest education level: Not on file  Occupational History  . Not on file  Tobacco Use  . Smoking status: Former Research scientist (life sciences)  . Smokeless tobacco: Never Used  Substance and Sexual Activity  . Alcohol use: Not Currently  . Drug use: Not on file  . Sexual activity: Not on file  Other Topics Concern  . Not on file  Social History Narrative  . Not on file   Social Determinants of Health   Financial Resource Strain:   .  Difficulty of Paying Living Expenses: Not on file  Food Insecurity:   . Worried About Charity fundraiser in the Last Year: Not on file  . Ran Out of Food in the Last Year: Not on file  Transportation Needs:   . Lack of Transportation (Medical): Not on file  . Lack of Transportation (Non-Medical): Not on file  Physical Activity:   . Days of Exercise per Week: Not on file  . Minutes of Exercise per Session: Not on file  Stress:   . Feeling of Stress : Not on file  Social Connections:   . Frequency of Communication with Friends and Family: Not on file  . Frequency of Social Gatherings with Friends and Family: Not on file  . Attends Religious Services: Not on file  . Active Member of Clubs or Organizations: Not on file  . Attends Archivist Meetings: Not on file  . Marital Status: Not on file   Tobacco Counseling Counseling given: Not Answered   Clinical Intake: Pain : No/denies pain    Activities of Daily Living In your present state of health, do you have any difficulty performing the following activities: 06/06/2019  Hearing? N  Vision? N  Difficulty concentrating or making decisions? N  Walking or climbing stairs? N  Dressing or bathing? N  Doing errands, shopping? N  Preparing Food and eating ? N  Using the Toilet? N  In the past six months, have you accidently leaked urine? N  Do you have problems with loss of bowel control? N  Managing your Medications? N  Managing your Finances? N  Housekeeping or managing your Housekeeping? N  Some recent data might be hidden     Immunizations and Health Maintenance Immunization History  Administered Date(s) Administered  . Influenza, High Dose Seasonal PF 04/08/2018  . Influenza-Unspecified 04/15/2017  . Pneumococcal Polysaccharide-23 12/16/2018   Health Maintenance Due  Topic Date Due  . FOOT EXAM  02/04/1958  . TETANUS/TDAP  02/05/1967  . COLONOSCOPY  02/04/1998  . URINE MICROALBUMIN  10/22/2018  .  INFLUENZA VACCINE  01/06/2019    Patient Care Team: Libby Maw, MD as PCP - General (Family Medicine)  Indicate any recent Medical Services you may have received from other than [facility] providers in the past year (date may be approximate).    Assessment:   This is a routine wellness examination for [place]. Physical assessment deferred to PCP.  Hearing/Vision screen Unable to assess. This visit is enabled though telemedicine due to Covid 19.   Dietary issues and exercise activities discussed: Current Exercise Habits: The patient does not participate in regular exercise at present, Exercise limited by: None identified Diet (meal preparation, eat out, water intake, caffeinated beverages, dairy products, fruits and vegetables): 24 hr recall Breakfast: bacon, egg, and cheese, fries Lunch: late  breakfast Dinner: BBQ sandwich  Goals    . DIET - EAT MORE VEGETABLES      Depression Screen PHQ 2/9 Scores 06/06/2019  PHQ - 2 Score 0    Fall Risk Fall Risk  06/06/2019  Falls in the past year? 0  Follow up Education provided;Falls prevention discussed      Cognitive Function: Ad8 score reviewed for issues:  Issues making decisions:no  Less interest in hobbies / activities:no  Repeats questions, stories (family complaining):no  Trouble using ordinary gadgets (microwave, computer, phone):no  Forgets the month or year: no  Mismanaging finances: no  Remembering appts:no  Daily problems with thinking and/or memory:no Ad8 score is=0         Screening Tests Health Maintenance  Topic Date Due  . FOOT EXAM  02/04/1958  . TETANUS/TDAP  02/05/1967  . COLONOSCOPY  02/04/1998  . URINE MICROALBUMIN  10/22/2018  . INFLUENZA VACCINE  01/06/2019  . OPHTHALMOLOGY EXAM  06/17/2019  . HEMOGLOBIN A1C  08/04/2019  . PNA vac Low Risk Adult (2 of 2 - PCV13) 12/16/2019  . Hepatitis C Screening  Completed       Plan:    Please schedule your next medicare wellness visit  with me in 1 yr.  Continue to eat heart healthy diet (full of fruits, vegetables, whole grains, lean protein, water--limit salt, fat, and sugar intake) and increase physical activity as tolerated.  Continue doing brain stimulating activities (puzzles, reading, adult coloring books, staying active) to keep memory sharp.     I have personally reviewed and noted the following in the patient's chart:   . Medical and social history . Use of alcohol, tobacco or illicit drugs  . Current medications and supplements . Functional ability and status . Nutritional status . Physical activity . Advanced directives . List of other physicians . Hospitalizations, surgeries, and ER visits in previous 12 months . Vitals . Screenings to include cognitive, depression, and falls . Referrals and appointments  In addition, I have reviewed and discussed with patient certain preventive protocols, quality metrics, and best practice recommendations. A written personalized care plan for preventive services as well as general preventive health recommendations were provided to patient.     Shela Nevin, [place]   06/06/2019

## 2019-06-06 ENCOUNTER — Other Ambulatory Visit: Payer: Self-pay

## 2019-06-06 ENCOUNTER — Encounter: Payer: Self-pay | Admitting: *Deleted

## 2019-06-06 ENCOUNTER — Ambulatory Visit (INDEPENDENT_AMBULATORY_CARE_PROVIDER_SITE_OTHER): Payer: Medicare HMO | Admitting: *Deleted

## 2019-06-06 DIAGNOSIS — Z0000 Encounter for general adult medical examination without abnormal findings: Secondary | ICD-10-CM

## 2019-06-06 NOTE — Patient Instructions (Addendum)
Please schedule your next medicare wellness visit with me in 1 yr.  Continue to eat heart healthy diet (full of fruits, vegetables, whole grains, lean protein, water--limit salt, fat, and sugar intake) and increase physical activity as tolerated.  Continue doing brain stimulating activities (puzzles, reading, adult coloring books, staying active) to keep memory sharp.    Mitchell Rose , Thank you for taking time to come for your Medicare Wellness Visit. I appreciate your ongoing commitment to your health goals. Please review the following plan we discussed and let me know if I can assist you in the future.   These are the goals we discussed: Goals    . DIET - EAT MORE VEGETABLES       This is a list of the screening recommended for you and due dates:  Health Maintenance  Topic Date Due  . Complete foot exam   02/04/1958  . Tetanus Vaccine  02/05/1967  . Colon Cancer Screening  02/04/1998  . Urine Protein Check  10/22/2018  . Flu Shot  01/06/2019  . Eye exam for diabetics  06/17/2019  . Hemoglobin A1C  08/04/2019  . Pneumonia vaccines (2 of 2 - PCV13) 12/16/2019  .  Hepatitis C: One time screening is recommended by Center for Disease Control  (CDC) for  adults born from [birth year] through 1963.   Completed    Preventive Care 55 Years and Older, Male Preventive care refers to lifestyle choices and visits with your health care provider that can promote health and wellness. This includes:  A yearly physical exam. This is also called an annual well check.  Regular dental and eye exams.  Immunizations.  Screening for certain conditions.  Healthy lifestyle choices, such as diet and exercise. What can I expect for my preventive care visit? Physical exam Your health care provider will check:  Height and weight. These may be used to calculate body mass index (BMI), which is a measurement that tells if you are at a healthy weight.  Heart rate and blood pressure.  Your skin for abnormal  spots. Counseling Your health care provider may ask you questions about:  Alcohol, tobacco, and drug use.  Emotional well-being.  Home and relationship well-being.  Sexual activity.  Eating habits.  History of falls.  Memory and ability to understand (cognition).  Work and work Statistician. What immunizations do I need?  Influenza (flu) vaccine  This is recommended every year. Tetanus, diphtheria, and pertussis (Tdap) vaccine  You may need a Td booster every 10 years. Varicella (chickenpox) vaccine  You may need this vaccine if you have not already been vaccinated. Zoster (shingles) vaccine  You may need this after age 77. Pneumococcal conjugate (PCV13) vaccine  One dose is recommended after age 75. Pneumococcal polysaccharide (PPSV23) vaccine  One dose is recommended after age 59. Measles, mumps, and rubella (MMR) vaccine  You may need at least one dose of MMR if you were born in 1957 or later. You may also need a second dose. Meningococcal conjugate (MenACWY) vaccine  You may need this if you have certain conditions. Hepatitis A vaccine  You may need this if you have certain conditions or if you travel or work in places where you may be exposed to hepatitis A. Hepatitis B vaccine  You may need this if you have certain conditions or if you travel or work in places where you may be exposed to hepatitis B. Haemophilus influenzae type b (Hib) vaccine  You may need this if you have  certain conditions. You may receive vaccines as individual doses or as more than one vaccine together in one shot (combination vaccines). Talk with your health care provider about the risks and benefits of combination vaccines. What tests do I need? Blood tests  Lipid and cholesterol levels. These may be checked every 5 years, or more frequently depending on your overall health.  Hepatitis C test.  Hepatitis B test. Screening  Lung cancer screening. You may have this screening  every year starting at age 30 if you have a 30-pack-year history of smoking and currently smoke or have quit within the past 15 years.  Colorectal cancer screening. All adults should have this screening starting at age 56 and continuing until age 26. Your health care provider may recommend screening at age 81 if you are at increased risk. You will have tests every 1-10 years, depending on your results and the type of screening test.  Prostate cancer screening. Recommendations will vary depending on your family history and other risks.  Diabetes screening. This is done by checking your blood sugar (glucose) after you have not eaten for a while (fasting). You may have this done every 1-3 years.  Abdominal aortic aneurysm (AAA) screening. You may need this if you are a current or former smoker.  Sexually transmitted disease (STD) testing. Follow these instructions at home: Eating and drinking  Eat a diet that includes fresh fruits and vegetables, whole grains, lean protein, and low-fat dairy products. Limit your intake of foods with high amounts of sugar, saturated fats, and salt.  Take vitamin and mineral supplements as recommended by your health care provider.  Do not drink alcohol if your health care provider tells you not to drink.  If you drink alcohol: ? Limit how much you have to 0-2 drinks a day. ? Be aware of how much alcohol is in your drink. In the [place] one drink equals one 12 oz bottle of beer (355 mL), one 5 oz glass of wine (148 mL), or one 1 oz glass of hard liquor (44 mL). Lifestyle  Take daily care of your teeth and gums.  Stay active. Exercise for at least 30 minutes on 5 or more days each week.  Do not use any products that contain nicotine or tobacco, such as cigarettes, e-cigarettes, and chewing tobacco. If you need help quitting, ask your health care provider.  If you are sexually active, practice safe sex. Use a condom or other form of protection to prevent STIs  (sexually transmitted infections).  Talk with your health care provider about taking a low-dose aspirin or statin. What's next?  Visit your health care provider once a year for a well check visit.  Ask your health care provider how often you should have your eyes and teeth checked.  Stay up to date on all vaccines. This information is not intended to replace advice given to you by your health care provider. Make sure you discuss any questions you have with your health care provider. Document Released: 06/20/2015 Document Revised: 05/18/2018 Document Reviewed: 05/18/2018 Elsevier Patient Education  2020 [employer].

## 2019-06-21 ENCOUNTER — Other Ambulatory Visit: Payer: Self-pay | Admitting: Family Medicine

## 2019-06-21 DIAGNOSIS — H35033 Hypertensive retinopathy, bilateral: Secondary | ICD-10-CM | POA: Diagnosis not present

## 2019-06-21 DIAGNOSIS — H04123 Dry eye syndrome of bilateral lacrimal glands: Secondary | ICD-10-CM | POA: Diagnosis not present

## 2019-06-21 DIAGNOSIS — E113292 Type 2 diabetes mellitus with mild nonproliferative diabetic retinopathy without macular edema, left eye: Secondary | ICD-10-CM | POA: Diagnosis not present

## 2019-06-21 DIAGNOSIS — H2513 Age-related nuclear cataract, bilateral: Secondary | ICD-10-CM | POA: Diagnosis not present

## 2019-06-21 DIAGNOSIS — H524 Presbyopia: Secondary | ICD-10-CM | POA: Diagnosis not present

## 2019-06-21 NOTE — Telephone Encounter (Signed)
Pt aware Rx sent in, appt scheduled for OV 07/27/19

## 2019-06-23 ENCOUNTER — Other Ambulatory Visit: Payer: Self-pay | Admitting: Family Medicine

## 2019-06-23 DIAGNOSIS — I2510 Atherosclerotic heart disease of native coronary artery without angina pectoris: Secondary | ICD-10-CM

## 2019-06-23 DIAGNOSIS — I10 Essential (primary) hypertension: Secondary | ICD-10-CM

## 2019-07-27 ENCOUNTER — Ambulatory Visit: Payer: Medicare HMO | Admitting: Family Medicine

## 2019-08-02 ENCOUNTER — Other Ambulatory Visit: Payer: Self-pay

## 2019-08-03 ENCOUNTER — Ambulatory Visit (INDEPENDENT_AMBULATORY_CARE_PROVIDER_SITE_OTHER): Payer: Medicare HMO | Admitting: Family Medicine

## 2019-08-03 ENCOUNTER — Encounter: Payer: Self-pay | Admitting: Family Medicine

## 2019-08-03 VITALS — BP 126/84 | HR 81 | Temp 96.9°F | Ht 69.0 in | Wt 219.0 lb

## 2019-08-03 DIAGNOSIS — Z8616 Personal history of COVID-19: Secondary | ICD-10-CM | POA: Diagnosis not present

## 2019-08-03 DIAGNOSIS — I10 Essential (primary) hypertension: Secondary | ICD-10-CM

## 2019-08-03 DIAGNOSIS — E7800 Pure hypercholesterolemia, unspecified: Secondary | ICD-10-CM | POA: Diagnosis not present

## 2019-08-03 DIAGNOSIS — R202 Paresthesia of skin: Secondary | ICD-10-CM | POA: Insufficient documentation

## 2019-08-03 DIAGNOSIS — Z0000 Encounter for general adult medical examination without abnormal findings: Secondary | ICD-10-CM

## 2019-08-03 DIAGNOSIS — E611 Iron deficiency: Secondary | ICD-10-CM

## 2019-08-03 DIAGNOSIS — R3911 Hesitancy of micturition: Secondary | ICD-10-CM | POA: Diagnosis not present

## 2019-08-03 DIAGNOSIS — D649 Anemia, unspecified: Secondary | ICD-10-CM | POA: Diagnosis not present

## 2019-08-03 DIAGNOSIS — N401 Enlarged prostate with lower urinary tract symptoms: Secondary | ICD-10-CM | POA: Diagnosis not present

## 2019-08-03 DIAGNOSIS — E876 Hypokalemia: Secondary | ICD-10-CM | POA: Diagnosis not present

## 2019-08-03 DIAGNOSIS — E1165 Type 2 diabetes mellitus with hyperglycemia: Secondary | ICD-10-CM | POA: Diagnosis not present

## 2019-08-03 NOTE — Progress Notes (Signed)
Established Patient Office Visit  Subjective:  Patient ID: Mitchell Rose, male    DOB: [birth year]  Age: 74 y.o. MRN: [id number]  CC:  Chief Complaint  Patient presents with  . Follow-up    follow up on diabetes, patient states that his hands are numb every morning when he wakes up.     HPI Mitchell Rose presents for follow-up of his diabetes hypertension and elevated cholesterol.  Hypertension has been well controlled with amlodipine and chlorthalidone and metoprolol.  Cholesterol has been controlled with the Pravachol.  Currently taking Metformin for his diabetes.  Flomax is helping his urine flow.  Continue iron therapy for his iron deficiency anemia.  Status post first Covid vaccination.  He is status post Covid infection back in March of this past year.  For the last month or so he has been experiencing paresthesias in all of the fingers of both of his hands.  This these are worse in the morning.  He has to shake them to wake them.  Patient denies any neck pain or stiffness.  B12 and TSH levels have been normal with recent checks.  Continues to take Plaquenil and MTX for his rheumatoid arthritis.  Rheumatoid has been relatively quiesced sent with these medications.  Past Medical History:  Diagnosis Date  . Diabetes mellitus without complication ([place])   . Hypertension     Past Surgical History:  Procedure Laterality Date  . HERNIA REPAIR      No family history on file.  Social History   Socioeconomic History  . Marital status: Married    Spouse name: Not on file  . Number of children: Not on file  . Years of education: Not on file  . Highest education level: Not on file  Occupational History  . Not on file  Tobacco Use  . Smoking status: Former Research scientist (life sciences)  . Smokeless tobacco: Never Used  Substance and Sexual Activity  . Alcohol use: Not Currently  . Drug use: Not on file  . Sexual activity: Not on file  Other Topics Concern  . Not on file  Social History Narrative  .  Not on file   Social Determinants of Health   Financial Resource Strain: Low Risk   . Difficulty of Paying Living Expenses: Not hard at all  Food Insecurity: No Food Insecurity  . Worried About Charity fundraiser in the Last Year: Never true  . Ran Out of Food in the Last Year: Never true  Transportation Needs: No Transportation Needs  . Lack of Transportation (Medical): No  . Lack of Transportation (Non-Medical): No  Physical Activity:   . Days of Exercise per Week: Not on file  . Minutes of Exercise per Session: Not on file  Stress:   . Feeling of Stress : Not on file  Social Connections:   . Frequency of Communication with Friends and Family: Not on file  . Frequency of Social Gatherings with Friends and Family: Not on file  . Attends Religious Services: Not on file  . Active Member of Clubs or Organizations: Not on file  . Attends Archivist Meetings: Not on file  . Marital Status: Not on file  Intimate Partner Violence:   . Fear of Current or Ex-Partner: Not on file  . Emotionally Abused: Not on file  . Physically Abused: Not on file  . Sexually Abused: Not on file    Outpatient Medications Prior to Visit  Medication Sig Dispense Refill  . amLODipine (NORVASC)  10 MG tablet TAKE ONE TABLET BY MOUTH DAILY 90 tablet 1  . aspirin 81 MG chewable tablet Chew 1 tablet (81 mg total) by mouth daily. 365 tablet 1  . chlorthalidone (HYGROTON) 25 MG tablet TAKE ONE TABLET BY MOUTH DAILY 90 tablet 1  . ferrous sulfate 325 (65 FE) MG tablet Take 1 tablet (325 mg total) by mouth daily with breakfast. 90 tablet 3  . folic acid (FOLVITE) 1 MG tablet Take 1 tablet by mouth daily.    Marland Kitchen gabapentin (NEURONTIN) 300 MG capsule TAKE ONE CAPSULE BY MOUTH AT BEDTIME 90 capsule 0  . hydroxychloroquine (PLAQUENIL) 200 MG tablet Take 200 mg by mouth daily.    . metFORMIN (GLUCOPHAGE) 1000 MG tablet TAKE ONE TABLET BY MOUTH TWICE A DAY 180 tablet 1  . methotrexate (RHEUMATREX) 2.5 MG  tablet Take 17.5 mg by mouth once a week.     . metoprolol succinate (TOPROL-XL) 100 MG 24 hr tablet TAKE ONE TABLET BY MOUTH DAILY WITH OR IMMEDIATELY FOLLOWING A MEAL 90 tablet 2  . Multiple Vitamin (MULTIVITAMIN WITH MINERALS) TABS tablet Take 1 tablet by mouth daily.    Marland Kitchen omeprazole (PRILOSEC) 20 MG capsule TAKE ONE CAPSULE BY MOUTH DAILY 90 capsule 2  . potassium chloride SA (K-DUR) 20 MEQ tablet Take 1 tablet (20 mEq total) by mouth daily. 30 tablet 5  . pravastatin (PRAVACHOL) 20 MG tablet TAKE ONE TABLET BY MOUTH EVERY NIGHT AT BEDTIME 90 tablet 1  . predniSONE (DELTASONE) 5 MG tablet Take 5 mg by mouth daily as needed (achiness).     . tamsulosin (FLOMAX) 0.4 MG CAPS capsule TAKE ONE CAPSULE BY MOUTH DAILY AFTER SUPPER 90 capsule 0   No facility-administered medications prior to visit.    No Known Allergies  ROS Review of Systems  Constitutional: Negative.   HENT: Negative.   Eyes: Negative for photophobia and visual disturbance.  Respiratory: Negative.   Cardiovascular: Negative.   Gastrointestinal: Negative.   Endocrine: Negative for polyphagia and polyuria.  Genitourinary: Negative for difficulty urinating, frequency and urgency.  Musculoskeletal: Positive for arthralgias. Negative for gait problem.  Skin: Negative for pallor and rash.  Allergic/Immunologic: Negative for immunocompromised state.  Neurological: Positive for numbness. Negative for weakness.  Hematological: Does not bruise/bleed easily.  Psychiatric/Behavioral: Negative.       Objective:    Physical Exam  Constitutional: He is oriented to person, place, and time. He appears well-developed and well-nourished. No distress.  HENT:  Head: Normocephalic and atraumatic.  Right Ear: External ear normal.  Left Ear: External ear normal.  Eyes: Conjunctivae are normal. Right eye exhibits no discharge. Left eye exhibits no discharge. No scleral icterus.  Neck: No JVD present. No tracheal deviation present. No  thyromegaly present.  Cardiovascular: Normal rate, regular rhythm and normal heart sounds.  Pulmonary/Chest: Effort normal and breath sounds normal. No stridor.  Abdominal: Bowel sounds are normal.  Musculoskeletal:     Right shoulder: No tenderness or bony tenderness. Normal range of motion.     Left shoulder: No tenderness or bony tenderness. Normal range of motion.     Cervical back: No swelling, tenderness or bony tenderness. Normal range of motion.  Lymphadenopathy:    He has no cervical adenopathy.  Neurological: He is alert and oriented to person, place, and time. He has normal strength.  Reflex Scores:      Tricep reflexes are 1+ on the right side and 1+ on the left side.      Bicep  reflexes are 1+ on the right side and 1+ on the left side.      Brachioradialis reflexes are 2+ on the right side and 2+ on the left side. Neg Tinell's tests.   Skin: Skin is warm and dry. He is not diaphoretic.  Psychiatric: He has a normal mood and affect. His behavior is normal.    BP 126/84   Pulse 81   Temp (!) 96.9 F (36.1 C) (Tympanic)   Ht 5\' 9"  (1.753 m)   Wt 219 lb (99.3 kg)   SpO2 98%   BMI 32.34 kg/m  Wt Readings from Last 3 Encounters:  08/03/19 219 lb (99.3 kg)  02/01/19 216 lb 2 oz (98 kg)  10/31/18 208 lb 2 oz (94.4 kg)     Health Maintenance Due  Topic Date Due  . FOOT EXAM  02/04/1958  . TETANUS/TDAP  02/05/1967  . COLONOSCOPY  02/04/1998  . URINE MICROALBUMIN  10/22/2018    There are no preventive care reminders to display for this patient.  Lab Results  Component Value Date   TSH 1.36 07/18/2017   Lab Results  Component Value Date   WBC 5.8 02/01/2019   HGB 11.2 (L) 02/01/2019   HCT 33.8 (L) 02/01/2019   MCV 90.7 02/01/2019   PLT 388.0 02/01/2019   Lab Results  Component Value Date   NA 142 02/01/2019   K 3.2 (L) 02/01/2019   CO2 29 02/01/2019   GLUCOSE 86 02/01/2019   BUN 18 02/01/2019   CREATININE 0.94 02/01/2019   BILITOT 0.5 10/31/2018    ALKPHOS 57 09/26/2018   AST 13 10/31/2018   ALT 10 10/31/2018   PROT 7.2 10/31/2018   ALBUMIN 3.7 09/26/2018   CALCIUM 9.8 02/01/2019   ANIONGAP 16 (H) 09/26/2018   GFR 79.11 02/01/2019   Lab Results  Component Value Date   CHOL 100 04/24/2018   Lab Results  Component Value Date   HDL 36.00 (L) 04/24/2018   Lab Results  Component Value Date   LDLCALC 43 04/24/2018   Lab Results  Component Value Date   TRIG 105.0 04/24/2018   Lab Results  Component Value Date   CHOLHDL 3 04/24/2018   Lab Results  Component Value Date   HGBA1C 6.0 02/01/2019      Assessment & Plan:   Problem List Items Addressed This Visit      Cardiovascular and Mediastinum   Essential hypertension - Primary   Relevant Orders   CBC   Comprehensive metabolic panel   Urinalysis, Routine w reflex microscopic     Endocrine   Type 2 diabetes mellitus with hyperglycemia, without long-term current use of insulin (HCC)   Relevant Orders   Comprehensive metabolic panel   Hemoglobin A1c     Other   Healthcare maintenance   Relevant Orders   PSA   Elevated LDL cholesterol level   Relevant Orders   Comprehensive metabolic panel   LDL cholesterol, direct   History of 2019 novel coronavirus disease (COVID-19)   Iron deficiency   Relevant Orders   Iron, TIBC and Ferritin Panel   Paresthesias   Relevant Orders   Ambulatory referral to Neurology      No orders of the defined types were placed in this encounter.   Follow-up: No follow-ups on file.    Libby Maw, MD

## 2019-08-03 NOTE — Addendum Note (Signed)
Addended by: Lynda Rainwater on: 08/03/2019 02:41 PM   Modules accepted: Orders

## 2019-08-04 LAB — URINALYSIS, ROUTINE W REFLEX MICROSCOPIC
Bilirubin, UA: NEGATIVE
Glucose, UA: NEGATIVE
Ketones, UA: NEGATIVE
Nitrite, UA: NEGATIVE
Protein,UA: NEGATIVE
RBC, UA: NEGATIVE
Specific Gravity, UA: 1.021 (ref 1.005–1.030)
Urobilinogen, Ur: 1 mg/dL (ref 0.2–1.0)
pH, UA: 7.5 (ref 5.0–7.5)

## 2019-08-04 LAB — COMPREHENSIVE METABOLIC PANEL
ALT: 8 IU/L (ref 0–44)
AST: 15 IU/L (ref 0–40)
Albumin/Globulin Ratio: 1.3 (ref 1.2–2.2)
Albumin: 3.9 g/dL (ref 3.7–4.7)
Alkaline Phosphatase: 64 IU/L (ref 39–117)
BUN/Creatinine Ratio: 15 (ref 10–24)
BUN: 16 mg/dL (ref 8–27)
Bilirubin Total: 0.3 mg/dL (ref 0.0–1.2)
CO2: 26 mmol/L (ref 20–29)
Calcium: 9.4 mg/dL (ref 8.6–10.2)
Chloride: 101 mmol/L (ref 96–106)
Creatinine, Ser: 1.04 mg/dL (ref 0.76–1.27)
GFR calc Af Amer: 83 mL/min/{1.73_m2} (ref >59)
GFR calc non Af Amer: 72 mL/min/{1.73_m2} (ref >59)
Globulin, Total: 3 g/dL (ref 1.5–4.5)
Glucose: 78 mg/dL (ref 65–99)
Potassium: 3.9 mmol/L (ref 3.5–5.2)
Sodium: 141 mmol/L (ref 134–144)
Total Protein: 6.9 g/dL (ref 6.0–8.5)

## 2019-08-04 LAB — MICROSCOPIC EXAMINATION
Bacteria, UA: NONE SEEN
Casts: NONE SEEN /lpf

## 2019-08-04 LAB — CBC
Hematocrit: 34.4 % — ABNORMAL LOW (ref 37.5–51.0)
Hemoglobin: 11.6 g/dL — ABNORMAL LOW (ref 13.0–17.7)
MCH: 30.1 pg (ref 26.6–33.0)
MCHC: 33.7 g/dL (ref 31.5–35.7)
MCV: 89 fL (ref 79–97)
Platelets: 343 10*3/uL (ref 150–450)
RBC: 3.86 x10E6/uL — ABNORMAL LOW (ref 4.14–5.80)
RDW: 14.1 % (ref 11.6–15.4)
WBC: 6 10*3/uL (ref 3.4–10.8)

## 2019-08-04 LAB — IRON,TIBC AND FERRITIN PANEL
Ferritin: 94 ng/mL (ref 30–400)
Iron Saturation: 14 % — ABNORMAL LOW (ref 15–55)
Iron: 36 ug/dL — ABNORMAL LOW (ref 38–169)
Total Iron Binding Capacity: 263 ug/dL (ref 250–450)
UIBC: 227 ug/dL (ref 111–343)

## 2019-08-04 LAB — PSA: Prostate Specific Ag, Serum: 3.5 ng/mL (ref 0.0–4.0)

## 2019-08-04 LAB — HEMOGLOBIN A1C
Est. average glucose Bld gHb Est-mCnc: 120 mg/dL
Hgb A1c MFr Bld: 5.8 % — ABNORMAL HIGH (ref 4.8–5.6)

## 2019-08-04 LAB — LDL CHOLESTEROL, DIRECT: LDL Direct: 41 mg/dL (ref 0–99)

## 2019-08-24 DIAGNOSIS — M0579 Rheumatoid arthritis with rheumatoid factor of multiple sites without organ or systems involvement: Secondary | ICD-10-CM | POA: Diagnosis not present

## 2019-08-24 DIAGNOSIS — Z79899 Other long term (current) drug therapy: Secondary | ICD-10-CM | POA: Diagnosis not present

## 2019-08-31 ENCOUNTER — Other Ambulatory Visit: Payer: Self-pay | Admitting: Family Medicine

## 2019-08-31 DIAGNOSIS — N401 Enlarged prostate with lower urinary tract symptoms: Secondary | ICD-10-CM

## 2019-08-31 DIAGNOSIS — R3911 Hesitancy of micturition: Secondary | ICD-10-CM

## 2019-09-03 ENCOUNTER — Other Ambulatory Visit: Payer: Self-pay | Admitting: Family Medicine

## 2019-09-03 DIAGNOSIS — E876 Hypokalemia: Secondary | ICD-10-CM

## 2019-09-04 ENCOUNTER — Other Ambulatory Visit: Payer: Self-pay | Admitting: Family Medicine

## 2019-09-07 ENCOUNTER — Ambulatory Visit: Payer: Medicare HMO | Admitting: Diagnostic Neuroimaging

## 2019-09-07 ENCOUNTER — Other Ambulatory Visit: Payer: Self-pay | Admitting: Family Medicine

## 2019-09-28 ENCOUNTER — Telehealth: Payer: Self-pay | Admitting: Family Medicine

## 2019-09-28 NOTE — Progress Notes (Signed)
  Chronic Care Management   Outreach Note  09/28/2019 Name: Mitchell Rose MRN: [id number] DOB: 1945/07/25  Referred by: Libby Maw, MD Reason for referral : No chief complaint on file.   An unsuccessful telephone outreach was attempted today. The patient was referred to the pharmacist for assistance with care management and care coordination.  This note is not being shared with the patient for the following reason: To respect privacy (The patient or proxy has requested that the information not be shared). Follow Up Plan:   [name] Upstream Scheduler

## 2019-10-05 ENCOUNTER — Telehealth: Payer: Self-pay | Admitting: Family Medicine

## 2019-10-05 NOTE — Progress Notes (Signed)
  Chronic Care Management   Outreach Note  10/05/2019 Name: Dutch Piech MRN: [id number] DOB: Dec 11, 1945  Referred by: Libby Maw, MD Reason for referral : No chief complaint on file.   An unsuccessful telephone outreach was attempted today. The patient was referred to the pharmacist for assistance with care management and care coordination.   This note is not being shared with the patient for the following reason: To respect privacy (The patient or proxy has requested that the information not be shared).  Follow Up Plan:   [name] Upstream Scheduler

## 2019-11-02 ENCOUNTER — Other Ambulatory Visit: Payer: Self-pay | Admitting: Family Medicine

## 2019-11-02 DIAGNOSIS — E611 Iron deficiency: Secondary | ICD-10-CM

## 2019-11-15 ENCOUNTER — Telehealth: Payer: Self-pay | Admitting: Family Medicine

## 2019-11-15 NOTE — Progress Notes (Signed)
  Chronic Care Management   Outreach Note  11/15/2019 Name: Mitchell Rose MRN: [id number] DOB: [birth year]  Referred by: Libby Maw, MD Reason for referral : No chief complaint on file.   An unsuccessful telephone outreach was attempted today. The patient was referred to the pharmacist for assistance with care management and care coordination.   This note is not being shared with the patient for the following reason: To respect privacy (The patient or proxy has requested that the information not be shared).  Follow Up Plan:   [name] Upstream Scheduler

## 2019-11-27 DIAGNOSIS — Z79899 Other long term (current) drug therapy: Secondary | ICD-10-CM | POA: Diagnosis not present

## 2019-11-27 DIAGNOSIS — M0579 Rheumatoid arthritis with rheumatoid factor of multiple sites without organ or systems involvement: Secondary | ICD-10-CM | POA: Diagnosis not present

## 2019-11-27 DIAGNOSIS — G5603 Carpal tunnel syndrome, bilateral upper limbs: Secondary | ICD-10-CM | POA: Diagnosis not present

## 2019-11-28 ENCOUNTER — Other Ambulatory Visit: Payer: Self-pay | Admitting: Family Medicine

## 2019-11-28 DIAGNOSIS — E1165 Type 2 diabetes mellitus with hyperglycemia: Secondary | ICD-10-CM

## 2019-12-05 ENCOUNTER — Other Ambulatory Visit: Payer: Self-pay | Admitting: Family Medicine

## 2019-12-05 NOTE — Telephone Encounter (Signed)
Last oV 08/03/19 Last fill 09/08/19  #90/0 Next OV 02/01/20

## 2019-12-12 DIAGNOSIS — E669 Obesity, unspecified: Secondary | ICD-10-CM | POA: Diagnosis not present

## 2019-12-12 DIAGNOSIS — D649 Anemia, unspecified: Secondary | ICD-10-CM | POA: Diagnosis not present

## 2019-12-12 DIAGNOSIS — K219 Gastro-esophageal reflux disease without esophagitis: Secondary | ICD-10-CM | POA: Diagnosis not present

## 2019-12-12 DIAGNOSIS — G8929 Other chronic pain: Secondary | ICD-10-CM | POA: Diagnosis not present

## 2019-12-12 DIAGNOSIS — I10 Essential (primary) hypertension: Secondary | ICD-10-CM | POA: Diagnosis not present

## 2019-12-12 DIAGNOSIS — G3184 Mild cognitive impairment, so stated: Secondary | ICD-10-CM | POA: Diagnosis not present

## 2019-12-12 DIAGNOSIS — E785 Hyperlipidemia, unspecified: Secondary | ICD-10-CM | POA: Diagnosis not present

## 2019-12-12 DIAGNOSIS — M0550 Rheumatoid polyneuropathy with rheumatoid arthritis of unspecified site: Secondary | ICD-10-CM | POA: Diagnosis not present

## 2019-12-12 DIAGNOSIS — E1142 Type 2 diabetes mellitus with diabetic polyneuropathy: Secondary | ICD-10-CM | POA: Diagnosis not present

## 2019-12-12 DIAGNOSIS — G4730 Sleep apnea, unspecified: Secondary | ICD-10-CM | POA: Diagnosis not present

## 2019-12-15 ENCOUNTER — Other Ambulatory Visit: Payer: Self-pay | Admitting: Family Medicine

## 2019-12-15 DIAGNOSIS — I10 Essential (primary) hypertension: Secondary | ICD-10-CM

## 2020-01-13 IMAGING — DX PORTABLE CHEST - 1 VIEW
1 series · 1 of 1 positions shown · non-contrast
Comparison: None.

CLINICAL DATA: Dry cough and short of breath for 4 days

EXAM:
PORTABLE CHEST 1 VIEW

[chest ap]
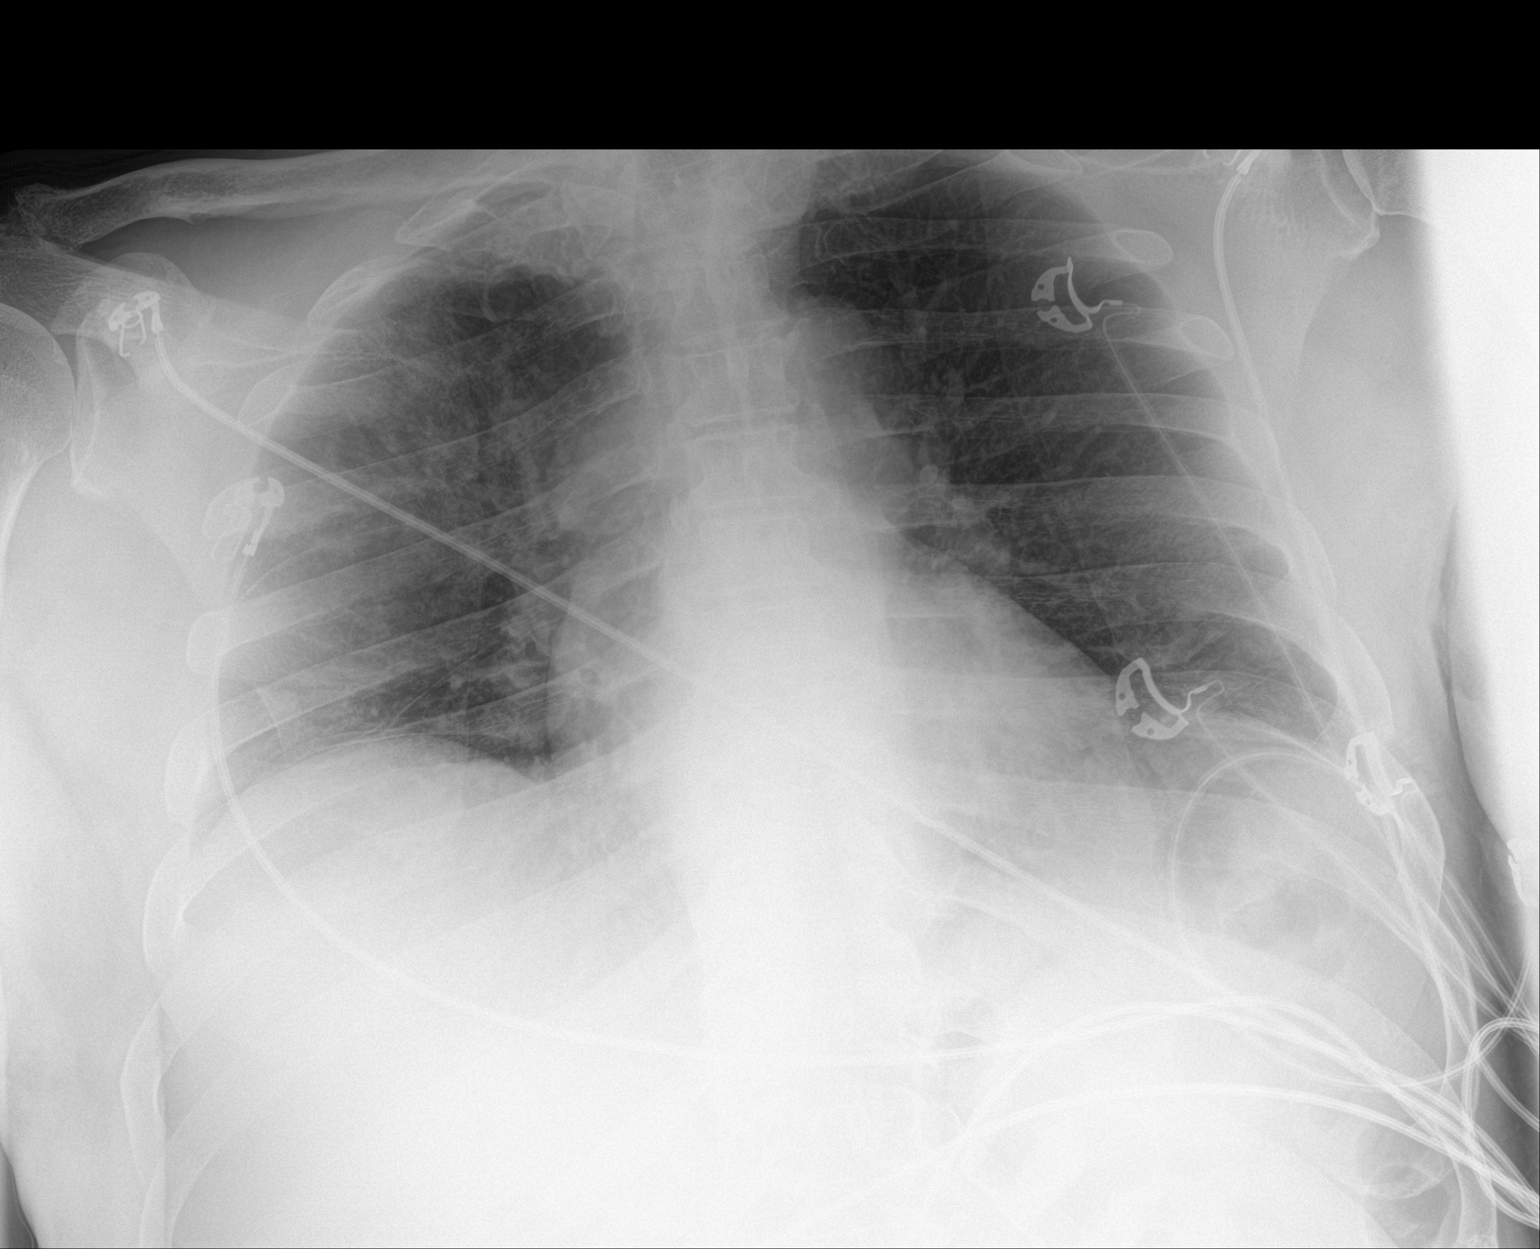

[1 of 1 positions shown; findings below may reference images not displayed]

FINDINGS: Normal heart size. Lungs are under aerated with bibasilar
atelectasis. No pneumothorax. No pleural effusion.
IMPRESSION: Bibasilar atelectasis and low lung volumes.

## 2020-01-21 DIAGNOSIS — G5603 Carpal tunnel syndrome, bilateral upper limbs: Secondary | ICD-10-CM | POA: Diagnosis not present

## 2020-01-31 ENCOUNTER — Other Ambulatory Visit: Payer: Self-pay

## 2020-02-01 ENCOUNTER — Ambulatory Visit (INDEPENDENT_AMBULATORY_CARE_PROVIDER_SITE_OTHER): Payer: Medicare HMO | Admitting: Family Medicine

## 2020-02-01 ENCOUNTER — Encounter: Payer: Self-pay | Admitting: Family Medicine

## 2020-02-01 VITALS — BP 120/82 | HR 84 | Temp 97.6°F | Ht 69.0 in | Wt 213.8 lb

## 2020-02-01 DIAGNOSIS — E1165 Type 2 diabetes mellitus with hyperglycemia: Secondary | ICD-10-CM

## 2020-02-01 DIAGNOSIS — E611 Iron deficiency: Secondary | ICD-10-CM | POA: Diagnosis not present

## 2020-02-01 DIAGNOSIS — I2510 Atherosclerotic heart disease of native coronary artery without angina pectoris: Secondary | ICD-10-CM | POA: Diagnosis not present

## 2020-02-01 DIAGNOSIS — N401 Enlarged prostate with lower urinary tract symptoms: Secondary | ICD-10-CM

## 2020-02-01 DIAGNOSIS — R3911 Hesitancy of micturition: Secondary | ICD-10-CM | POA: Diagnosis not present

## 2020-02-01 DIAGNOSIS — I10 Essential (primary) hypertension: Secondary | ICD-10-CM

## 2020-02-01 DIAGNOSIS — E7800 Pure hypercholesterolemia, unspecified: Secondary | ICD-10-CM

## 2020-02-01 LAB — CBC
HCT: 35 % — ABNORMAL LOW (ref 39.0–52.0)
Hemoglobin: 11.6 g/dL — ABNORMAL LOW (ref 13.0–17.0)
MCHC: 33.2 g/dL (ref 30.0–36.0)
MCV: 91.2 fl (ref 78.0–100.0)
Platelets: 336 10*3/uL (ref 150.0–400.0)
RBC: 3.84 Mil/uL — ABNORMAL LOW (ref 4.22–5.81)
RDW: 15.3 % (ref 11.5–15.5)
WBC: 4.2 10*3/uL (ref 4.0–10.5)

## 2020-02-01 LAB — URINALYSIS, ROUTINE W REFLEX MICROSCOPIC
Bilirubin Urine: NEGATIVE
Hgb urine dipstick: NEGATIVE
Ketones, ur: NEGATIVE
Nitrite: NEGATIVE
RBC / HPF: NONE SEEN (ref 0–?)
Specific Gravity, Urine: 1.025 (ref 1.000–1.030)
Total Protein, Urine: NEGATIVE
Urine Glucose: NEGATIVE
Urobilinogen, UA: 1 (ref 0.0–1.0)
pH: 6 (ref 5.0–8.0)

## 2020-02-01 LAB — MICROALBUMIN / CREATININE URINE RATIO
Creatinine,U: 137.6 mg/dL
Microalb Creat Ratio: 0.7 mg/g (ref 0.0–30.0)
Microalb, Ur: 0.9 mg/dL (ref 0.0–1.9)

## 2020-02-01 LAB — COMPREHENSIVE METABOLIC PANEL
ALT: 9 U/L (ref 0–53)
AST: 13 U/L (ref 0–37)
Albumin: 4.1 g/dL (ref 3.5–5.2)
Alkaline Phosphatase: 56 U/L (ref 39–117)
BUN: 18 mg/dL (ref 6–23)
CO2: 26 mEq/L (ref 19–32)
Calcium: 9.6 mg/dL (ref 8.4–10.5)
Chloride: 101 mEq/L (ref 96–112)
Creatinine, Ser: 1.02 mg/dL (ref 0.40–1.50)
GFR: 71.79 mL/min (ref >60.00)
Glucose, Bld: 91 mg/dL (ref 70–99)
Potassium: 3.9 mEq/L (ref 3.5–5.1)
Sodium: 138 mEq/L (ref 135–145)
Total Bilirubin: 0.4 mg/dL (ref 0.2–1.2)
Total Protein: 7 g/dL (ref 6.0–8.3)

## 2020-02-01 LAB — LDL CHOLESTEROL, DIRECT: Direct LDL: 56 mg/dL

## 2020-02-01 LAB — HEMOGLOBIN A1C: Hgb A1c MFr Bld: 6 % (ref 4.6–6.5)

## 2020-02-01 NOTE — Progress Notes (Signed)
Established Patient Office Visit  Subjective:  Patient ID: Mitchell Rose, male    DOB: Dec 21, 1945  Age: 74 y.o. MRN: [id number]  CC:  Chief Complaint  Patient presents with  . Follow-up    6 month follow up, no concerns    HPI Mitchell Rose presents for follow-up of hypertension, elevated cholesterol, diabetes and iron deficiency.  Blood pressures controlled well with amlodipine chlorthalidone and metoprolol.  Continues Klor-Con for potassium supplementation.  Patient has been taking his iron pills.  Has been able to lose some weight but continues with Metformin for diabetes without issue.  Tamsulosin continues to help urine flow.  Past Medical History:  Diagnosis Date  . Diabetes mellitus without complication ([place])   . Hypertension     Past Surgical History:  Procedure Laterality Date  . HERNIA REPAIR      History reviewed. No pertinent family history.  Social History   Socioeconomic History  . Marital status: Married    Spouse name: Not on file  . Number of children: Not on file  . Years of education: Not on file  . Highest education level: Not on file  Occupational History  . Not on file  Tobacco Use  . Smoking status: Former Research scientist (life sciences)  . Smokeless tobacco: Never Used  Vaping Use  . Vaping Use: Never used  Substance and Sexual Activity  . Alcohol use: Not Currently  . Drug use: Not on file  . Sexual activity: Not on file  Other Topics Concern  . Not on file  Social History Narrative  . Not on file   Social Determinants of Health   Financial Resource Strain: Low Risk   . Difficulty of Paying Living Expenses: Not hard at all  Food Insecurity: No Food Insecurity  . Worried About Charity fundraiser in the Last Year: Never true  . Ran Out of Food in the Last Year: Never true  Transportation Needs: No Transportation Needs  . Lack of Transportation (Medical): No  . Lack of Transportation (Non-Medical): No  Physical Activity:   . Days of Exercise per Week:  Not on file  . Minutes of Exercise per Session: Not on file  Stress:   . Feeling of Stress : Not on file  Social Connections:   . Frequency of Communication with Friends and Family: Not on file  . Frequency of Social Gatherings with Friends and Family: Not on file  . Attends Religious Services: Not on file  . Active Member of Clubs or Organizations: Not on file  . Attends Archivist Meetings: Not on file  . Marital Status: Not on file  Intimate Partner Violence:   . Fear of Current or Ex-Partner: Not on file  . Emotionally Abused: Not on file  . Physically Abused: Not on file  . Sexually Abused: Not on file    Outpatient Medications Prior to Visit  Medication Sig Dispense Refill  . amLODipine (NORVASC) 10 MG tablet TAKE ONE TABLET BY MOUTH DAILY 73 tablet 0  . aspirin 81 MG chewable tablet Chew 1 tablet (81 mg total) by mouth daily. 365 tablet 1  . chlorthalidone (HYGROTON) 25 MG tablet TAKE ONE TABLET BY MOUTH DAILY 64 tablet 0  . FEROSUL 325 (65 Fe) MG tablet TAKE ONE TABLET BY MOUTH DAILY WITH BREAKFAST 90 tablet 2  . folic acid (FOLVITE) 1 MG tablet Take 1 tablet by mouth daily.    Marland Kitchen gabapentin (NEURONTIN) 300 MG capsule TAKE ONE CAPSULE BY MOUTH AT BEDTIME  90 capsule 0  . hydroxychloroquine (PLAQUENIL) 200 MG tablet Take 200 mg by mouth daily.    Marland Kitchen KLOR-CON M20 20 MEQ tablet TAKE ONE TABLET BY MOUTH DAILY 90 tablet 1  . metFORMIN (GLUCOPHAGE) 1000 MG tablet TAKE ONE TABLET BY MOUTH TWICE A DAY 180 tablet 0  . methotrexate (RHEUMATREX) 2.5 MG tablet Take 17.5 mg by mouth once a week.     . metoprolol succinate (TOPROL-XL) 100 MG 24 hr tablet TAKE ONE TABLET BY MOUTH DAILY WITH OR IMMEDIATELY FOLLOWING A MEAL 90 tablet 2  . Multiple Vitamin (MULTIVITAMIN WITH MINERALS) TABS tablet Take 1 tablet by mouth daily.    Marland Kitchen omeprazole (PRILOSEC) 20 MG capsule TAKE ONE CAPSULE BY MOUTH DAILY 96 capsule 2  . pravastatin (PRAVACHOL) 20 MG tablet TAKE ONE TABLET BY MOUTH EVERY NIGHT  AT BEDTIME 90 tablet 1  . predniSONE (DELTASONE) 5 MG tablet Take 5 mg by mouth daily as needed (achiness).     . tamsulosin (FLOMAX) 0.4 MG CAPS capsule TAKE ONE CAPSULE BY MOUTH DAILY AFTER SUPPER 64 capsule 2   No facility-administered medications prior to visit.    No Known Allergies  ROS Review of Systems  Constitutional: Negative.   HENT: Negative.   Eyes: Negative for photophobia and visual disturbance.  Respiratory: Negative.   Cardiovascular: Negative.   Gastrointestinal: Negative.   Endocrine: Negative for polyphagia and polyuria.  Genitourinary: Negative for difficulty urinating, frequency and urgency.  Musculoskeletal: Negative for gait problem and joint swelling.  Skin: Negative for pallor and rash.  Allergic/Immunologic: Negative for immunocompromised state.  Neurological: Negative for light-headedness and numbness.  Hematological: Does not bruise/bleed easily.  Psychiatric/Behavioral: Negative.       Objective:    Physical Exam Vitals and nursing note reviewed.  Constitutional:      General: He is not in acute distress.    Appearance: Normal appearance. He is normal weight. He is not ill-appearing, toxic-appearing or diaphoretic.  HENT:     Head: Normocephalic and atraumatic.     Right Ear: Tympanic membrane, ear canal and external ear normal.     Left Ear: Tympanic membrane, ear canal and external ear normal.     Mouth/Throat:     Mouth: Mucous membranes are moist.     Pharynx: Oropharynx is clear. No oropharyngeal exudate or posterior oropharyngeal erythema.  Eyes:     General: No scleral icterus.       Right eye: No discharge.        Left eye: No discharge.     Conjunctiva/sclera: Conjunctivae normal.  Cardiovascular:     Rate and Rhythm: Normal rate and regular rhythm.  Pulmonary:     Effort: Pulmonary effort is normal.     Breath sounds: Normal breath sounds.  Abdominal:     General: Bowel sounds are normal.  Musculoskeletal:     Cervical  back: No rigidity or tenderness.     Right lower leg: No edema.     Left lower leg: No edema.  Lymphadenopathy:     Cervical: No cervical adenopathy.  Skin:    General: Skin is warm and dry.  Neurological:     Mental Status: He is alert and oriented to person, place, and time.  Psychiatric:        Mood and Affect: Mood normal.        Behavior: Behavior normal.     BP 120/82   Pulse 84   Temp 97.6 F (36.4 C) (Tympanic)   Ht  5\' 9"  (1.753 m)   Wt 213 lb 12.8 oz (97 kg)   SpO2 98%   BMI 31.57 kg/m  Wt Readings from Last 3 Encounters:  02/01/20 213 lb 12.8 oz (97 kg)  08/03/19 219 lb (99.3 kg)  02/01/19 216 lb 2 oz (98 kg)     Health Maintenance Due  Topic Date Due  . FOOT EXAM  Never done  . TETANUS/TDAP  Never done  . COLONOSCOPY  Never done  . URINE MICROALBUMIN  10/22/2018  . PNA vac Low Risk Adult (2 of 2 - PCV13) 12/16/2019  . INFLUENZA VACCINE  01/06/2020  . HEMOGLOBIN A1C  01/31/2020    There are no preventive care reminders to display for this patient.  Lab Results  Component Value Date   TSH 1.36 07/18/2017   Lab Results  Component Value Date   WBC 6.0 08/03/2019   HGB 11.6 (L) 08/03/2019   HCT 34.4 (L) 08/03/2019   MCV 89 08/03/2019   PLT 343 08/03/2019   Lab Results  Component Value Date   NA 141 08/03/2019   K 3.9 08/03/2019   CO2 26 08/03/2019   GLUCOSE 78 08/03/2019   BUN 16 08/03/2019   CREATININE 1.04 08/03/2019   BILITOT 0.3 08/03/2019   ALKPHOS 64 08/03/2019   AST 15 08/03/2019   ALT 8 08/03/2019   PROT 6.9 08/03/2019   ALBUMIN 3.9 08/03/2019   CALCIUM 9.4 08/03/2019   ANIONGAP 16 (H) 09/26/2018   GFR 79.11 02/01/2019   Lab Results  Component Value Date   CHOL 100 04/24/2018   Lab Results  Component Value Date   HDL 36.00 (L) 04/24/2018   Lab Results  Component Value Date   LDLCALC 43 04/24/2018   Lab Results  Component Value Date   TRIG 105.0 04/24/2018   Lab Results  Component Value Date   CHOLHDL 3  04/24/2018   Lab Results  Component Value Date   HGBA1C 5.8 (H) 08/03/2019      Assessment & Plan:   Problem List Items Addressed This Visit      Cardiovascular and Mediastinum   Essential hypertension - Primary   Relevant Orders   CBC   Comprehensive metabolic panel   Urinalysis, Routine w reflex microscopic   Microalbumin / creatinine urine ratio   ASCVD (arteriosclerotic cardiovascular disease)     Endocrine   Type 2 diabetes mellitus with hyperglycemia, without long-term current use of insulin (HCC)   Relevant Orders   Comprehensive metabolic panel   Urinalysis, Routine w reflex microscopic   Hemoglobin A1c   Microalbumin / creatinine urine ratio     Other   Elevated LDL cholesterol level   Relevant Orders   Comprehensive metabolic panel   LDL cholesterol, direct   Iron deficiency   Relevant Orders   CBC   Iron, TIBC and Ferritin Panel      No orders of the defined types were placed in this encounter.   Follow-up: Return in about 6 months (around 08/03/2020).  Continue current medicines follow-up in 6 months.  Libby Maw, MD

## 2020-02-02 LAB — IRON,TIBC AND FERRITIN PANEL
%SAT: 17 % (calc) — ABNORMAL LOW (ref 20–48)
Ferritin: 82 ng/mL (ref 24–380)
Iron: 50 ug/dL (ref 50–180)
TIBC: 294 mcg/dL (calc) (ref 250–425)

## 2020-02-20 ENCOUNTER — Other Ambulatory Visit: Payer: Self-pay | Admitting: Family Medicine

## 2020-02-20 DIAGNOSIS — I10 Essential (primary) hypertension: Secondary | ICD-10-CM

## 2020-02-24 ENCOUNTER — Other Ambulatory Visit: Payer: Self-pay | Admitting: Family Medicine

## 2020-02-24 DIAGNOSIS — E1165 Type 2 diabetes mellitus with hyperglycemia: Secondary | ICD-10-CM

## 2020-03-01 ENCOUNTER — Other Ambulatory Visit: Payer: Self-pay | Admitting: Family Medicine

## 2020-03-02 ENCOUNTER — Other Ambulatory Visit: Payer: Self-pay | Admitting: Family Medicine

## 2020-03-06 DIAGNOSIS — M0579 Rheumatoid arthritis with rheumatoid factor of multiple sites without organ or systems involvement: Secondary | ICD-10-CM | POA: Diagnosis not present

## 2020-03-06 DIAGNOSIS — Z79899 Other long term (current) drug therapy: Secondary | ICD-10-CM | POA: Diagnosis not present

## 2020-03-09 ENCOUNTER — Other Ambulatory Visit: Payer: Self-pay | Admitting: Family Medicine

## 2020-03-09 DIAGNOSIS — E1165 Type 2 diabetes mellitus with hyperglycemia: Secondary | ICD-10-CM

## 2020-03-09 DIAGNOSIS — I10 Essential (primary) hypertension: Secondary | ICD-10-CM

## 2020-03-09 DIAGNOSIS — N401 Enlarged prostate with lower urinary tract symptoms: Secondary | ICD-10-CM

## 2020-03-09 DIAGNOSIS — R3911 Hesitancy of micturition: Secondary | ICD-10-CM

## 2020-03-09 DIAGNOSIS — I2510 Atherosclerotic heart disease of native coronary artery without angina pectoris: Secondary | ICD-10-CM

## 2020-03-11 ENCOUNTER — Other Ambulatory Visit: Payer: Self-pay

## 2020-03-11 DIAGNOSIS — E7800 Pure hypercholesterolemia, unspecified: Secondary | ICD-10-CM

## 2020-03-11 DIAGNOSIS — I2510 Atherosclerotic heart disease of native coronary artery without angina pectoris: Secondary | ICD-10-CM

## 2020-03-11 MED ORDER — PRAVASTATIN SODIUM 20 MG PO TABS
20.0000 mg | ORAL_TABLET | Freq: Every day | ORAL | 1 refills | Status: AC
Start: 2020-03-11 — End: ?

## 2020-03-21 ENCOUNTER — Other Ambulatory Visit: Payer: Self-pay | Admitting: Family Medicine

## 2020-03-21 DIAGNOSIS — E876 Hypokalemia: Secondary | ICD-10-CM

## 2020-03-24 ENCOUNTER — Other Ambulatory Visit: Payer: Self-pay

## 2020-03-24 DIAGNOSIS — E876 Hypokalemia: Secondary | ICD-10-CM

## 2020-03-24 MED ORDER — POTASSIUM CHLORIDE CRYS ER 20 MEQ PO TBCR: 20.0000 meq | EXTENDED_RELEASE_TABLET | Freq: Every day | ORAL | 1 refills | Status: DC

## 2020-03-24 NOTE — Telephone Encounter (Signed)
Medication resent today due to having E-scribe being down Friday, 03/21/2020.

## 2020-03-31 DIAGNOSIS — I2510 Atherosclerotic heart disease of native coronary artery without angina pectoris: Secondary | ICD-10-CM | POA: Diagnosis not present

## 2020-03-31 DIAGNOSIS — E782 Mixed hyperlipidemia: Secondary | ICD-10-CM | POA: Diagnosis not present

## 2020-03-31 DIAGNOSIS — Z87891 Personal history of nicotine dependence: Secondary | ICD-10-CM | POA: Diagnosis not present

## 2020-03-31 DIAGNOSIS — Z7982 Long term (current) use of aspirin: Secondary | ICD-10-CM | POA: Diagnosis not present

## 2020-03-31 DIAGNOSIS — M0579 Rheumatoid arthritis with rheumatoid factor of multiple sites without organ or systems involvement: Secondary | ICD-10-CM | POA: Diagnosis not present

## 2020-03-31 DIAGNOSIS — I10 Essential (primary) hypertension: Secondary | ICD-10-CM | POA: Diagnosis not present

## 2020-03-31 DIAGNOSIS — I159 Secondary hypertension, unspecified: Secondary | ICD-10-CM | POA: Diagnosis not present

## 2020-04-21 ENCOUNTER — Other Ambulatory Visit: Payer: Self-pay | Admitting: Family Medicine

## 2020-04-21 DIAGNOSIS — I10 Essential (primary) hypertension: Secondary | ICD-10-CM

## 2020-04-21 NOTE — Telephone Encounter (Signed)
Last OV 02/01/20 Last fill 02/20/20  #64/0

## 2020-05-21 ENCOUNTER — Telehealth: Payer: Self-pay | Admitting: Family Medicine

## 2020-05-21 NOTE — Telephone Encounter (Signed)
Spoke with patient he stated that he change his TOC to [facility].  He will call the office back with the new provider.

## 2020-06-22 ENCOUNTER — Other Ambulatory Visit: Payer: Self-pay | Admitting: Family Medicine

## 2020-06-22 DIAGNOSIS — I10 Essential (primary) hypertension: Secondary | ICD-10-CM

## 2020-08-08 ENCOUNTER — Telehealth: Payer: Self-pay | Admitting: Family Medicine

## 2020-09-07 ENCOUNTER — Other Ambulatory Visit: Payer: Self-pay | Admitting: Family Medicine

## 2020-09-10 ENCOUNTER — Other Ambulatory Visit: Payer: Self-pay | Admitting: Family Medicine

## 2020-09-10 DIAGNOSIS — I10 Essential (primary) hypertension: Secondary | ICD-10-CM

## 2020-09-10 DIAGNOSIS — I2510 Atherosclerotic heart disease of native coronary artery without angina pectoris: Secondary | ICD-10-CM

## 2020-09-20 ENCOUNTER — Other Ambulatory Visit: Payer: Self-pay | Admitting: Family Medicine

## 2020-09-20 DIAGNOSIS — E876 Hypokalemia: Secondary | ICD-10-CM

## 2020-10-08 ENCOUNTER — Other Ambulatory Visit: Payer: Self-pay

## 2020-10-08 DIAGNOSIS — I10 Essential (primary) hypertension: Secondary | ICD-10-CM

## 2020-10-08 MED ORDER — CHLORTHALIDONE 25 MG PO TABS
25.0000 mg | ORAL_TABLET | Freq: Every day | ORAL | 0 refills | Status: DC
Start: 2020-10-08 — End: 2021-01-09

## 2020-12-06 ENCOUNTER — Other Ambulatory Visit: Payer: Self-pay | Admitting: Family Medicine

## 2020-12-06 DIAGNOSIS — E876 Hypokalemia: Secondary | ICD-10-CM

## 2021-01-09 ENCOUNTER — Other Ambulatory Visit: Payer: Self-pay | Admitting: Family Medicine

## 2021-01-09 DIAGNOSIS — I10 Essential (primary) hypertension: Secondary | ICD-10-CM

## 2021-02-07 ENCOUNTER — Other Ambulatory Visit: Payer: Self-pay | Admitting: Family Medicine

## 2021-02-14 ENCOUNTER — Telehealth: Payer: Self-pay

## 2021-02-14 NOTE — Telephone Encounter (Signed)
Pt declined, states he is no longer a pt at [facility].

## 2021-03-09 ENCOUNTER — Other Ambulatory Visit: Payer: Self-pay | Admitting: Family Medicine

## 2021-03-09 DIAGNOSIS — I10 Essential (primary) hypertension: Secondary | ICD-10-CM

## 2021-03-11 NOTE — Telephone Encounter (Signed)
Called patient to schedule appointment per patient he have switched PCP's to a different Provider.

## 2021-05-08 ENCOUNTER — Other Ambulatory Visit: Payer: Self-pay | Admitting: Family Medicine

## 2021-05-08 DIAGNOSIS — I10 Essential (primary) hypertension: Secondary | ICD-10-CM

## 2021-07-07 ENCOUNTER — Other Ambulatory Visit: Payer: Self-pay | Admitting: Family Medicine

## 2021-07-07 DIAGNOSIS — I10 Essential (primary) hypertension: Secondary | ICD-10-CM

## 2021-07-22 NOTE — Telephone Encounter (Signed)
Patient have currently switched PCP per pt he have let the pharmacy know.

## 2021-08-04 ENCOUNTER — Other Ambulatory Visit: Payer: Self-pay | Admitting: Family Medicine

## 2021-08-04 DIAGNOSIS — I10 Essential (primary) hypertension: Secondary | ICD-10-CM

## 2022-01-09 ENCOUNTER — Other Ambulatory Visit: Payer: Self-pay | Admitting: Family Medicine

## 2022-02-10 ENCOUNTER — Other Ambulatory Visit: Payer: Self-pay | Admitting: Family Medicine

## 2022-05-06 ENCOUNTER — Other Ambulatory Visit: Payer: Self-pay | Admitting: Family Medicine
